# Patient Record
Sex: Female | Born: 1970
Health system: Southern US, Community
[De-identification: ages and names within clinical notes are randomized; demographics above are authoritative.]

## PROBLEM LIST (undated history)

## (undated) DIAGNOSIS — G4730 Sleep apnea, unspecified: Secondary | ICD-10-CM

## (undated) DIAGNOSIS — K219 Gastro-esophageal reflux disease without esophagitis: Secondary | ICD-10-CM

## (undated) DIAGNOSIS — R51 Headache: Secondary | ICD-10-CM

## (undated) DIAGNOSIS — M722 Plantar fascial fibromatosis: Secondary | ICD-10-CM

## (undated) DIAGNOSIS — R519 Headache, unspecified: Secondary | ICD-10-CM

## (undated) DIAGNOSIS — N920 Excessive and frequent menstruation with regular cycle: Secondary | ICD-10-CM

## (undated) DIAGNOSIS — F419 Anxiety disorder, unspecified: Secondary | ICD-10-CM

## (undated) DIAGNOSIS — Z9889 Other specified postprocedural states: Secondary | ICD-10-CM

## (undated) DIAGNOSIS — G2581 Restless legs syndrome: Secondary | ICD-10-CM

## (undated) DIAGNOSIS — Z9289 Personal history of other medical treatment: Secondary | ICD-10-CM

## (undated) DIAGNOSIS — R112 Nausea with vomiting, unspecified: Secondary | ICD-10-CM

## (undated) DIAGNOSIS — D259 Leiomyoma of uterus, unspecified: Secondary | ICD-10-CM

## (undated) HISTORY — DX: Personal history of other medical treatment: Z92.89

## (undated) HISTORY — DX: Excessive and frequent menstruation with regular cycle: N92.0

## (undated) HISTORY — DX: Leiomyoma of uterus, unspecified: D25.9

## (undated) HISTORY — DX: Plantar fascial fibromatosis: M72.2

## (undated) HISTORY — DX: Restless legs syndrome: G25.81

---

## 2008-08-18 DIAGNOSIS — D259 Leiomyoma of uterus, unspecified: Secondary | ICD-10-CM

## 2008-08-18 HISTORY — DX: Leiomyoma of uterus, unspecified: D25.9

## 2010-07-09 ENCOUNTER — Ambulatory Visit: Payer: Self-pay

## 2011-07-21 ENCOUNTER — Ambulatory Visit: Payer: Self-pay | Admitting: Physician Assistant

## 2011-08-05 ENCOUNTER — Emergency Department: Payer: Self-pay | Admitting: Internal Medicine

## 2011-08-19 HISTORY — PX: LAPAROSCOPIC SUPRACERVICAL HYSTERECTOMY: SUR797

## 2011-08-19 HISTORY — PX: LAPAROSCOPIC BILATERAL SALPINGO OOPHERECTOMY: SHX5890

## 2011-09-23 ENCOUNTER — Ambulatory Visit: Payer: Self-pay

## 2012-03-01 LAB — HM PAP SMEAR: HM Pap smear: NORMAL

## 2012-03-11 ENCOUNTER — Ambulatory Visit: Payer: Self-pay

## 2012-03-11 LAB — HCG, QUANTITATIVE, PREGNANCY: Beta Hcg, Quant.: <1 m[IU]/mL — ABNORMAL LOW

## 2012-03-11 LAB — HEMATOCRIT: HCT: 37 % (ref 35.0–47.0)

## 2012-03-25 ENCOUNTER — Ambulatory Visit: Payer: Self-pay

## 2012-03-25 LAB — PREGNANCY, URINE: Pregnancy Test, Urine: NEGATIVE m[IU]/mL

## 2012-03-26 LAB — HEMOGLOBIN: HGB: 11 g/dL — ABNORMAL LOW (ref 12.0–16.0)

## 2012-03-26 LAB — PATHOLOGY REPORT

## 2012-12-22 ENCOUNTER — Ambulatory Visit: Payer: Self-pay

## 2012-12-22 LAB — HM MAMMOGRAPHY: HM Mammogram: NORMAL

## 2012-12-28 ENCOUNTER — Ambulatory Visit: Payer: Self-pay

## 2012-12-28 DIAGNOSIS — Z9289 Personal history of other medical treatment: Secondary | ICD-10-CM

## 2012-12-28 HISTORY — DX: Personal history of other medical treatment: Z92.89

## 2012-12-30 ENCOUNTER — Ambulatory Visit (INDEPENDENT_AMBULATORY_CARE_PROVIDER_SITE_OTHER): Payer: 59 | Admitting: Internal Medicine

## 2012-12-30 ENCOUNTER — Encounter: Payer: Self-pay | Admitting: Internal Medicine

## 2012-12-30 VITALS — BP 120/80 | HR 87 | Temp 98.7°F | Ht 65.0 in | Wt 234.0 lb

## 2012-12-30 DIAGNOSIS — R0683 Snoring: Secondary | ICD-10-CM | POA: Insufficient documentation

## 2012-12-30 DIAGNOSIS — R0609 Other forms of dyspnea: Secondary | ICD-10-CM

## 2012-12-30 DIAGNOSIS — E669 Obesity, unspecified: Secondary | ICD-10-CM | POA: Insufficient documentation

## 2012-12-30 DIAGNOSIS — G2581 Restless legs syndrome: Secondary | ICD-10-CM | POA: Insufficient documentation

## 2012-12-30 DIAGNOSIS — N951 Menopausal and female climacteric states: Secondary | ICD-10-CM | POA: Insufficient documentation

## 2012-12-30 DIAGNOSIS — Z0000 Encounter for general adult medical examination without abnormal findings: Secondary | ICD-10-CM

## 2012-12-30 LAB — CBC WITH DIFFERENTIAL/PLATELET
Eosinophils Relative: 2 % (ref 0.0–5.0)
Lymphocytes Relative: 40.4 % (ref 12.0–46.0)
MCV: 83.6 fl (ref 78.0–100.0)
Monocytes Absolute: 0.7 10*3/uL (ref 0.1–1.0)
Neutrophils Relative %: 49.8 % (ref 43.0–77.0)
Platelets: 256 10*3/uL (ref 150.0–400.0)
WBC: 9.6 10*3/uL (ref 4.5–10.5)

## 2012-12-30 LAB — COMPREHENSIVE METABOLIC PANEL
Albumin: 4.2 g/dL (ref 3.5–5.2)
Alkaline Phosphatase: 75 U/L (ref 39–117)
CO2: 27 mEq/L (ref 19–32)
Calcium: 9.2 mg/dL (ref 8.4–10.5)
Chloride: 103 mEq/L (ref 96–112)
GFR: 78.71 mL/min (ref >60.00)
Glucose, Bld: 87 mg/dL (ref 70–99)
Potassium: 3.8 mEq/L (ref 3.5–5.1)
Sodium: 138 mEq/L (ref 135–145)
Total Protein: 7.6 g/dL (ref 6.0–8.3)

## 2012-12-30 LAB — LDL CHOLESTEROL, DIRECT: Direct LDL: 149.4 mg/dL

## 2012-12-30 LAB — TSH: TSH: 0.56 u[IU]/mL (ref 0.35–5.50)

## 2012-12-30 MED ORDER — ROPINIROLE HCL 0.25 MG PO TABS: 0.2500 mg | ORAL_TABLET | Freq: Every day | ORAL | Status: DC

## 2012-12-30 NOTE — Assessment & Plan Note (Signed)
Given history of obesity and snoring, recommended sleep study to evaluate for sleep apnea.

## 2012-12-30 NOTE — Assessment & Plan Note (Signed)
Restless legs limiting pt ability to sleep. Will start Requip 0.25mg  at bedtime, and plan to titrate up as tolerated.  Pt will email with update after 2-3 days on medication.

## 2012-12-30 NOTE — Assessment & Plan Note (Signed)
Symptoms increased irritability and anxious mood may be related to lack of sleep complicated by hormone changes after recent hysterectomy. We discussed potentially starting fluoxetine or sertraline to help with symptoms and with hot flashes. We also discussed some of the benefits and risks of hormone replacement. Will hold off to see how she tolerates Requip and see if symptoms are improved with better sleep.

## 2012-12-30 NOTE — Progress Notes (Signed)
Subjective:    Patient ID: Michele Rivera, female    DOB: [birth year], 42 y.o.   MRN: [id number]  HPI  42 year old female presents to establish care. She reports that she underwent hysterectomy last year. Since that time, she has noticed feeling more emotional with increased frequency of crying episodes. She also reports some increased anxiety. She reports it has been very difficult for her to sleep as she occasionally has hot flashes and has frequent episodes of restless legs. These episodes wake her from sleep. She had sleep study over 14 years ago which she reports was normal except for over 400 episodes of muscle jerking in her legs, resulting in waking from sleep. However, since that time she has gained over 20 pounds. She notes that her family reports that she snores. She feels exhausted during the day and has not been able to start an exercise program.  No outpatient prescriptions prior to visit.   No facility-administered medications prior to visit.   BP 120/80  Pulse 87  Temp(Src) 98.7 F (37.1 C) (Oral)  Ht 5\' 5"  (1.651 m)  Wt 234 lb (106.142 kg)  BMI 38.94 kg/m2  SpO2 96%  Review of Systems  Constitutional: Negative for fever, chills, appetite change, fatigue and unexpected weight change.  HENT: Negative for ear pain, congestion, sore throat, trouble swallowing, neck pain, voice change and sinus pressure.   Eyes: Negative for visual disturbance.  Respiratory: Negative for cough, shortness of breath, wheezing and stridor.   Cardiovascular: Negative for chest pain, palpitations and leg swelling.  Gastrointestinal: Negative for nausea, vomiting, abdominal pain, diarrhea, constipation, blood in stool, abdominal distention and anal bleeding.  Genitourinary: Negative for dysuria and flank pain.  Musculoskeletal: Negative for myalgias, arthralgias and gait problem.  Skin: Negative for color change and rash.  Neurological: Positive for tremors. Negative for dizziness and headaches.   Hematological: Negative for adenopathy. Does not bruise/bleed easily.  Psychiatric/Behavioral: Positive for sleep disturbance and dysphoric mood. Negative for suicidal ideas. The patient is not nervous/anxious.        Objective:   Physical Exam  Constitutional: She is oriented to person, place, and time. She appears well-developed and well-nourished. No distress.  HENT:  Head: Normocephalic and atraumatic.  Right Ear: External ear normal.  Left Ear: External ear normal.  Nose: Nose normal.  Mouth/Throat: Oropharynx is clear and moist. No oropharyngeal exudate.  Eyes: Conjunctivae are normal. Pupils are equal, round, and reactive to light. Right eye exhibits no discharge. Left eye exhibits no discharge. No scleral icterus.  Neck: Normal range of motion. Neck supple. No tracheal deviation present. No thyromegaly present.  Cardiovascular: Normal rate, regular rhythm, normal heart sounds and intact distal pulses.  Exam reveals no gallop and no friction rub.   No murmur heard. Pulmonary/Chest: Effort normal and breath sounds normal. No accessory muscle usage. Not tachypneic. No respiratory distress. She has no decreased breath sounds. She has no wheezes. She has no rhonchi. She has no rales. She exhibits no tenderness.  Abdominal: Soft. Bowel sounds are normal. She exhibits no distension. There is no tenderness. There is no rebound and no guarding.  Musculoskeletal: Normal range of motion. She exhibits no edema and no tenderness.  Lymphadenopathy:    She has no cervical adenopathy.  Neurological: She is alert and oriented to person, place, and time. No cranial nerve deficit. She exhibits normal muscle tone. Coordination normal.  Skin: Skin is warm and dry. No rash noted. She is not diaphoretic. No erythema. No pallor.  Psychiatric: She has a normal mood and affect. Her behavior is normal. Judgment and thought content normal.          Assessment & Plan:

## 2012-12-30 NOTE — Assessment & Plan Note (Signed)
Body mass index is 38.94 kg/(m^2). Suspect that limited sleep is affecting patient's ability to lose weight. Will address sleeping disorder and see if energy level is improved as a result. Will also check TSH with labs.

## 2012-12-31 LAB — VITAMIN D 25 HYDROXY (VIT D DEFICIENCY, FRACTURES): Vit D, 25-Hydroxy: 46 ng/mL (ref 30–89)

## 2013-01-06 ENCOUNTER — Encounter: Payer: Self-pay | Admitting: Internal Medicine

## 2013-01-25 ENCOUNTER — Ambulatory Visit (INDEPENDENT_AMBULATORY_CARE_PROVIDER_SITE_OTHER): Payer: 59 | Admitting: Internal Medicine

## 2013-01-25 ENCOUNTER — Encounter: Payer: Self-pay | Admitting: Internal Medicine

## 2013-01-25 VITALS — BP 110/80 | HR 85 | Temp 98.8°F | Wt 235.0 lb

## 2013-01-25 DIAGNOSIS — F411 Generalized anxiety disorder: Secondary | ICD-10-CM

## 2013-01-25 DIAGNOSIS — N951 Menopausal and female climacteric states: Secondary | ICD-10-CM

## 2013-01-25 DIAGNOSIS — G2581 Restless legs syndrome: Secondary | ICD-10-CM

## 2013-01-25 MED ORDER — FLUOXETINE HCL 20 MG PO TABS: 20.0000 mg | ORAL_TABLET | Freq: Every day | ORAL | Status: DC

## 2013-01-25 NOTE — Assessment & Plan Note (Signed)
Persistent hot flashes and increased anxiety. Will start Fluoxetine to help with symptoms. Follow up 4 weeks and prn.

## 2013-01-25 NOTE — Progress Notes (Signed)
Subjective:    Patient ID: Michele Rivera, female    DOB: 1971-06-06, 42 y.o.   MRN: [id number]  HPI 42 year old female with history of restless leg syndrome, obesity presents for followup. At her last visit she was started on Requip. She reports significant improvement in symptoms on this medication. She only occasionally wakes at night to use the restroom. She no longer has jerking movements of her lower charities. She denies any noted side effects from the medicine.  She continues to have intermittent hot flashes described as drenching. She also notes increased anxiety particularly when at work. She has never taken medication for this.  She is scheduled for sleep study.  Outpatient Encounter Prescriptions as of 01/25/2013  Medication Sig Dispense Refill  . rOPINIRole (REQUIP) 0.25 MG tablet Take 1 tablet (0.25 mg total) by mouth daily.  90 tablet  3  . FLUoxetine (PROZAC) 20 MG tablet Take 1 tablet (20 mg total) by mouth daily.  30 tablet  3   No facility-administered encounter medications on file as of 01/25/2013.   BP 110/80  Pulse 85  Temp(Src) 98.8 F (37.1 C) (Oral)  Wt 235 lb (106.595 kg)  BMI 39.11 kg/m2  SpO2 96%  Review of Systems  Constitutional: Positive for diaphoresis. Negative for fever, chills, appetite change, fatigue and unexpected weight change.  HENT: Negative for ear pain, congestion, sore throat, trouble swallowing, neck pain, voice change and sinus pressure.   Eyes: Negative for visual disturbance.  Respiratory: Negative for cough, shortness of breath, wheezing and stridor.   Cardiovascular: Negative for chest pain, palpitations and leg swelling.  Gastrointestinal: Negative for nausea, vomiting, abdominal pain, diarrhea, constipation, blood in stool, abdominal distention and anal bleeding.  Genitourinary: Negative for dysuria and flank pain.  Musculoskeletal: Negative for myalgias, arthralgias and gait problem.  Skin: Negative for color change and rash.   Neurological: Negative for dizziness and headaches.  Hematological: Negative for adenopathy. Does not bruise/bleed easily.  Psychiatric/Behavioral: Negative for suicidal ideas, sleep disturbance and dysphoric mood. The patient is nervous/anxious.        Objective:   Physical Exam  Constitutional: She is oriented to person, place, and time. She appears well-developed and well-nourished. No distress.  HENT:  Head: Normocephalic and atraumatic.  Right Ear: External ear normal.  Left Ear: External ear normal.  Nose: Nose normal.  Mouth/Throat: Oropharynx is clear and moist. No oropharyngeal exudate.  Eyes: Conjunctivae are normal. Pupils are equal, round, and reactive to light. Right eye exhibits no discharge. Left eye exhibits no discharge. No scleral icterus.  Neck: Normal range of motion. Neck supple. No tracheal deviation present. No thyromegaly present.  Cardiovascular: Normal rate, regular rhythm, normal heart sounds and intact distal pulses.  Exam reveals no gallop and no friction rub.   No murmur heard. Pulmonary/Chest: Effort normal and breath sounds normal. No accessory muscle usage. Not tachypneic. No respiratory distress. She has no decreased breath sounds. She has no wheezes. She has no rhonchi. She has no rales. She exhibits no tenderness.  Musculoskeletal: Normal range of motion. She exhibits no edema and no tenderness.  Lymphadenopathy:    She has no cervical adenopathy.  Neurological: She is alert and oriented to person, place, and time. No cranial nerve deficit. She exhibits normal muscle tone. Coordination normal.  Skin: Skin is warm and dry. No rash noted. She is not diaphoretic. No erythema. No pallor.  Psychiatric: She has a normal mood and affect. Her speech is normal and behavior is normal. Judgment and  thought content normal. Cognition and memory are normal. She expresses no suicidal ideation.          Assessment & Plan:

## 2013-01-25 NOTE — Assessment & Plan Note (Signed)
Symptoms much improved with use of Requip. Will continue.

## 2013-02-14 ENCOUNTER — Telehealth: Payer: Self-pay | Admitting: *Deleted

## 2013-02-14 ENCOUNTER — Encounter: Payer: Self-pay | Admitting: Internal Medicine

## 2013-02-14 MED ORDER — ROPINIROLE HCL 0.25 MG PO TABS: 0.5000 mg | ORAL_TABLET | Freq: Every day | ORAL | Status: DC

## 2013-02-14 NOTE — Telephone Encounter (Signed)
This has already been done and sent to pharmacy.

## 2013-02-14 NOTE — Telephone Encounter (Signed)
Pharmacy Note:  Ropinirole Hcl 0.25 mg TA   Pt stated that directions has changed, need new script

## 2013-02-14 NOTE — Telephone Encounter (Signed)
Patient sent mychart message stating she is taking 2 Requip instead of 1, that seems to work much better for her. Now she needs a new prescription sent to the pharmacy

## 2013-02-15 LAB — HM PAP SMEAR

## 2013-02-25 ENCOUNTER — Ambulatory Visit: Payer: 59 | Admitting: Internal Medicine

## 2013-03-11 ENCOUNTER — Ambulatory Visit: Payer: 59 | Admitting: Internal Medicine

## 2013-04-04 ENCOUNTER — Ambulatory Visit (INDEPENDENT_AMBULATORY_CARE_PROVIDER_SITE_OTHER): Payer: 59 | Admitting: Internal Medicine

## 2013-04-04 ENCOUNTER — Encounter: Payer: Self-pay | Admitting: Internal Medicine

## 2013-04-04 VITALS — BP 110/78 | HR 78 | Temp 98.3°F | Wt 229.0 lb

## 2013-04-04 DIAGNOSIS — R0609 Other forms of dyspnea: Secondary | ICD-10-CM

## 2013-04-04 DIAGNOSIS — R0683 Snoring: Secondary | ICD-10-CM

## 2013-04-04 DIAGNOSIS — F411 Generalized anxiety disorder: Secondary | ICD-10-CM

## 2013-04-04 DIAGNOSIS — E669 Obesity, unspecified: Secondary | ICD-10-CM

## 2013-04-04 DIAGNOSIS — N951 Menopausal and female climacteric states: Secondary | ICD-10-CM

## 2013-04-04 MED ORDER — PAROXETINE HCL 20 MG PO TABS: 20.0000 mg | ORAL_TABLET | ORAL | Status: DC

## 2013-04-04 MED ORDER — PHENTERMINE HCL 37.5 MG PO CAPS: 37.5000 mg | ORAL_CAPSULE | ORAL | Status: DC

## 2013-04-04 NOTE — Assessment & Plan Note (Signed)
Results of sleep study are pending. Will request sleep study report.

## 2013-04-04 NOTE — Progress Notes (Signed)
Subjective:    Patient ID: Michele Rivera, female    DOB: 1971/04/28, 42 y.o.   MRN: [id number]  HPI 42 year old female with history of restless legs, menopausal hot flashes, and obesity presents for followup. She was recently started on fluoxetine to help with anxiety and hot flashes. She reports symptoms of hot flashes are unchanged. However, symptoms of anxiety have been much improved with use of fluoxetine. However, ever since starting this medication, she has had nausea. She has tried changing the time of day that she takes the medicine with little improvement.  In regards to her obesity, she reports she has been following a healthier diet and has been walking every night approximately 1.5 miles. She has lost 6 pounds. She reports she is generally feeling much better.  In regards to recent sleep study, she reports he was a very difficult study and she only slept for approximately one hour. She has not yet received results on this study.  Outpatient Encounter Prescriptions as of 04/04/2013  Medication Sig Dispense Refill  . rOPINIRole (REQUIP) 0.25 MG tablet Take 2 tablets (0.5 mg total) by mouth daily.  90 tablet  3  . [DISCONTINUED] FLUoxetine (PROZAC) 20 MG tablet Take 1 tablet (20 mg total) by mouth daily.  30 tablet  3  . PARoxetine (PAXIL) 20 MG tablet Take 1 tablet (20 mg total) by mouth every morning.  30 tablet  6  . phentermine 37.5 MG capsule Take 1 capsule (37.5 mg total) by mouth every morning.  30 capsule  1  . [DISCONTINUED] phentermine 37.5 MG capsule Take 1 capsule (37.5 mg total) by mouth every morning.  30 capsule  1   No facility-administered encounter medications on file as of 04/04/2013.   BP 110/78  Pulse 78  Temp(Src) 98.3 F (36.8 C) (Oral)  Wt 229 lb (103.874 kg)  BMI 38.11 kg/m2  SpO2 97%  Review of Systems  Constitutional: Positive for diaphoresis. Negative for fever, chills, appetite change, fatigue and unexpected weight change.  HENT: Negative for ear  pain, congestion, sore throat, trouble swallowing, neck pain, voice change and sinus pressure.   Eyes: Negative for visual disturbance.  Respiratory: Negative for cough, shortness of breath, wheezing and stridor.   Cardiovascular: Negative for chest pain, palpitations and leg swelling.  Gastrointestinal: Positive for nausea. Negative for vomiting, abdominal pain, diarrhea, constipation, blood in stool, abdominal distention and anal bleeding.  Genitourinary: Negative for dysuria and flank pain.  Musculoskeletal: Negative for myalgias, arthralgias and gait problem.  Skin: Negative for color change and rash.  Neurological: Negative for dizziness and headaches.  Hematological: Negative for adenopathy. Does not bruise/bleed easily.  Psychiatric/Behavioral: Negative for suicidal ideas, sleep disturbance and dysphoric mood. The patient is not nervous/anxious.        Objective:   Physical Exam  Constitutional: She is oriented to person, place, and time. She appears well-developed and well-nourished. No distress.  HENT:  Head: Normocephalic and atraumatic.  Right Ear: External ear normal.  Left Ear: External ear normal.  Nose: Nose normal.  Mouth/Throat: Oropharynx is clear and moist. No oropharyngeal exudate.  Eyes: Conjunctivae are normal. Pupils are equal, round, and reactive to light. Right eye exhibits no discharge. Left eye exhibits no discharge. No scleral icterus.  Neck: Normal range of motion. Neck supple. No tracheal deviation present. No thyromegaly present.  Cardiovascular: Normal rate, regular rhythm, normal heart sounds and intact distal pulses.  Exam reveals no gallop and no friction rub.   No murmur heard. Pulmonary/Chest: Effort  normal and breath sounds normal. No accessory muscle usage. Not tachypneic. No respiratory distress. She has no decreased breath sounds. She has no wheezes. She has no rhonchi. She has no rales. She exhibits no tenderness.  Musculoskeletal: Normal range of  motion. She exhibits no edema and no tenderness.  Lymphadenopathy:    She has no cervical adenopathy.  Neurological: She is alert and oriented to person, place, and time. No cranial nerve deficit. She exhibits normal muscle tone. Coordination normal.  Skin: Skin is warm and dry. No rash noted. She is not diaphoretic. No erythema. No pallor.  Psychiatric: She has a normal mood and affect. Her behavior is normal. Judgment and thought content normal.          Assessment & Plan:

## 2013-04-04 NOTE — Assessment & Plan Note (Signed)
Wt Readings from Last 3 Encounters:  04/04/13 229 lb (103.874 kg)  01/25/13 235 lb (106.595 kg)  12/30/12 234 lb (106.142 kg)   Congratulated patient on 6 pounds weight loss. Encouraged her to continue efforts at healthy diet and regular physical activity. Encouraged her to keep a food diary. Will start phentermine to help with appetite suppression. Discussed potential risk of this medication. Followup in 4 weeks.

## 2013-04-04 NOTE — Assessment & Plan Note (Signed)
Symptoms of hot flashes poorly controlled with addition of fluoxetine. Will try changing to paroxetine 20 mg daily. Discussed other alterntives including SERMs if this is not helpful. Follow up 4 weeks and prn.

## 2013-04-04 NOTE — Assessment & Plan Note (Signed)
Symptoms of anxiety are well controlled with use of fluoxetine however patient is having nausea with this medication. Will try changing to paroxetine. Follow up 4 weeks and prn.

## 2013-04-05 ENCOUNTER — Telehealth: Payer: Self-pay | Admitting: Internal Medicine

## 2013-04-05 NOTE — Telephone Encounter (Signed)
Were you requesting anything from [facility] about this patient?

## 2013-04-05 NOTE — Telephone Encounter (Signed)
Yes, I need a copy of her original sleep study.

## 2013-04-05 NOTE — Telephone Encounter (Signed)
Michele Rivera from [facility] called inquiring on a medical release form that was faxed to her on this patient for records. The patient had her initial sleep study on 8/9, records were faxed to [place] according to them, and the patient is s/u for her Cpap appointment on 9/20. What records are we needing?

## 2013-04-06 NOTE — Telephone Encounter (Signed)
Called and they will fax that information to [place].

## 2013-04-07 ENCOUNTER — Telehealth: Payer: Self-pay | Admitting: Internal Medicine

## 2013-04-07 ENCOUNTER — Encounter: Payer: Self-pay | Admitting: Internal Medicine

## 2013-04-07 NOTE — Telephone Encounter (Signed)
Sleep study showed OSA, recommend CPAP titration

## 2013-04-11 ENCOUNTER — Encounter: Payer: Self-pay | Admitting: Internal Medicine

## 2013-04-26 ENCOUNTER — Encounter: Payer: Self-pay | Admitting: Internal Medicine

## 2013-04-26 MED ORDER — FLUOXETINE HCL 20 MG PO TABS: 20.0000 mg | ORAL_TABLET | Freq: Every day | ORAL | Status: DC

## 2013-05-02 ENCOUNTER — Encounter: Payer: Self-pay | Admitting: Internal Medicine

## 2013-05-03 ENCOUNTER — Ambulatory Visit: Payer: 59 | Admitting: Internal Medicine

## 2013-06-07 ENCOUNTER — Encounter: Payer: Self-pay | Admitting: Internal Medicine

## 2013-06-10 ENCOUNTER — Ambulatory Visit: Payer: 59 | Admitting: Internal Medicine

## 2013-06-14 ENCOUNTER — Ambulatory Visit: Payer: 59 | Admitting: Internal Medicine

## 2013-06-27 ENCOUNTER — Encounter: Payer: Self-pay | Admitting: *Deleted

## 2013-06-28 ENCOUNTER — Ambulatory Visit (INDEPENDENT_AMBULATORY_CARE_PROVIDER_SITE_OTHER): Payer: 59 | Admitting: Internal Medicine

## 2013-06-28 ENCOUNTER — Encounter: Payer: Self-pay | Admitting: Internal Medicine

## 2013-06-28 VITALS — BP 124/78 | HR 82 | Temp 98.1°F | Resp 12 | Wt 227.0 lb

## 2013-06-28 DIAGNOSIS — H6690 Otitis media, unspecified, unspecified ear: Secondary | ICD-10-CM

## 2013-06-28 DIAGNOSIS — E669 Obesity, unspecified: Secondary | ICD-10-CM

## 2013-06-28 DIAGNOSIS — G2581 Restless legs syndrome: Secondary | ICD-10-CM

## 2013-06-28 DIAGNOSIS — H6692 Otitis media, unspecified, left ear: Secondary | ICD-10-CM

## 2013-06-28 MED ORDER — ROPINIROLE HCL 1 MG PO TABS: 1.0000 mg | ORAL_TABLET | Freq: Every day | ORAL | Status: DC

## 2013-06-28 MED ORDER — AMOXICILLIN-POT CLAVULANATE 875-125 MG PO TABS: 1.0000 | ORAL_TABLET | Freq: Two times a day (BID) | ORAL | Status: DC

## 2013-06-28 MED ORDER — PHENTERMINE HCL 37.5 MG PO CAPS: ORAL_CAPSULE | ORAL | Status: DC

## 2013-06-28 MED ORDER — FLUOXETINE HCL 20 MG PO TABS: 20.0000 mg | ORAL_TABLET | Freq: Every day | ORAL | Status: DC

## 2013-06-28 NOTE — Progress Notes (Signed)
Pre-visit discussion using our clinic review tool. No additional management support is needed unless otherwise documented below in the visit note.  

## 2013-06-29 DIAGNOSIS — H6692 Otitis media, unspecified, left ear: Secondary | ICD-10-CM | POA: Insufficient documentation

## 2013-06-29 NOTE — Assessment & Plan Note (Signed)
Exam is consistent with left otitis media. Will treat with Augmentin. Patient will call if symptoms are not improving. Followup for recheck in 4 weeks.

## 2013-06-29 NOTE — Progress Notes (Signed)
Subjective:    Patient ID: Michele Rivera, female    DOB: Oct 12, 1970, 42 y.o.   MRN: [id number]  HPI 42 year old female with history of restless legs, anxiety, and obesity presents for followup. In regards to her obesity, she reports she tried using phentermine to help with appetite suppression but developed shaking chills on this medication. She has tried to follow a healthy diet and exercise by walking. She has lost another 2 pounds.  In regards to restless legs, she reports symptoms have recently worsened. She is currently taking 0.5 mg of Requip at bedtime. She is waking up throughout the night with cramping and movement in her legs. She denies any daytime drowsiness.  She is also concerned about 3 week history of nasal congestion and left ear pain described as pressure. She denies any recent fever or chills. She denies any change in hearing. She has had some occasional drainage from her left ear.  Outpatient Prescriptions Prior to Visit  Medication Sig Dispense Refill  . rOPINIRole (REQUIP) 0.25 MG tablet Take 2 tablets (0.5 mg total) by mouth daily.  90 tablet  3  . FLUoxetine (PROZAC) 20 MG tablet Take 1 tablet (20 mg total) by mouth daily.  30 tablet  3  . phentermine 37.5 MG capsule Take 1 capsule (37.5 mg total) by mouth every morning.  30 capsule  1   No facility-administered medications prior to visit.   BP 124/78  Pulse 82  Temp(Src) 98.1 F (36.7 C) (Oral)  Resp 12  Wt 227 lb (102.967 kg)  SpO2 98%  Review of Systems  Constitutional: Negative for fever, chills, appetite change, fatigue and unexpected weight change.  HENT: Positive for congestion, ear discharge and ear pain. Negative for sinus pressure, sore throat, trouble swallowing and voice change.   Eyes: Negative for visual disturbance.  Respiratory: Negative for cough, shortness of breath, wheezing and stridor.   Cardiovascular: Negative for chest pain, palpitations and leg swelling.  Gastrointestinal: Negative  for nausea, vomiting, abdominal pain, diarrhea, constipation, blood in stool, abdominal distention and anal bleeding.  Genitourinary: Negative for dysuria and flank pain.  Musculoskeletal: Negative for arthralgias, gait problem, myalgias and neck pain.  Skin: Negative for color change and rash.  Neurological: Negative for dizziness and headaches.  Hematological: Negative for adenopathy. Does not bruise/bleed easily.  Psychiatric/Behavioral: Negative for suicidal ideas, sleep disturbance and dysphoric mood. The patient is not nervous/anxious.        Objective:   Physical Exam  Constitutional: She is oriented to person, place, and time. She appears well-developed and well-nourished. No distress.  HENT:  Head: Normocephalic and atraumatic.  Right Ear: Tympanic membrane, external ear and ear canal normal.  Left Ear: External ear and ear canal normal. Tympanic membrane is erythematous and bulging. A middle ear effusion is present.  Nose: Nose normal.  Mouth/Throat: Oropharynx is clear and moist. No oropharyngeal exudate.  Eyes: Conjunctivae are normal. Pupils are equal, round, and reactive to light. Right eye exhibits no discharge. Left eye exhibits no discharge. No scleral icterus.  Neck: Normal range of motion. Neck supple. No tracheal deviation present. No thyromegaly present.  Cardiovascular: Normal rate, regular rhythm, normal heart sounds and intact distal pulses.  Exam reveals no gallop and no friction rub.   No murmur heard. Pulmonary/Chest: Effort normal and breath sounds normal. No accessory muscle usage. Not tachypneic. No respiratory distress. She has no decreased breath sounds. She has no wheezes. She has no rhonchi. She has no rales. She exhibits no tenderness.  Musculoskeletal: Normal range of motion. She exhibits no edema and no tenderness.  Lymphadenopathy:    She has no cervical adenopathy.  Neurological: She is alert and oriented to person, place, and time. No cranial nerve  deficit. She exhibits normal muscle tone. Coordination normal.  Skin: Skin is warm and dry. No rash noted. She is not diaphoretic. No erythema. No pallor.  Psychiatric: She has a normal mood and affect. Her behavior is normal. Judgment and thought content normal.          Assessment & Plan:

## 2013-06-29 NOTE — Assessment & Plan Note (Signed)
Wt Readings from Last 3 Encounters:  06/28/13 227 lb (102.967 kg)  04/04/13 229 lb (103.874 kg)  01/25/13 235 lb (106.595 kg)   Body mass index is 37.77 kg/(m^2). Congratulated patient on another 2 pound weight loss. We discussed other methods to help with appetite suppression including using Wellbutrin. She would like to try to reduce dose of phentermine to see if this works for her. Will try 15 mg every morning. If any recurrent shaking chills or other symptoms, she will stop the medication.

## 2013-06-29 NOTE — Assessment & Plan Note (Signed)
Symptoms poorly controlled with current dose of Requip. Will increase Requip dose to 1 mg at bedtime. Patient will call if symptoms are not improving. We also discussed less traditional methods to help improve leg cramping at night.

## 2013-07-01 MED ORDER — BUPROPION HCL ER (XL) 150 MG PO TB24: 150.0000 mg | ORAL_TABLET | Freq: Every day | ORAL | Status: DC

## 2013-07-01 NOTE — Addendum Note (Signed)
Addended by: Ronna Polio A on: 07/01/2013 10:53 AM   Modules accepted: Orders

## 2013-07-26 ENCOUNTER — Ambulatory Visit: Payer: 59 | Admitting: Internal Medicine

## 2013-08-30 ENCOUNTER — Ambulatory Visit (INDEPENDENT_AMBULATORY_CARE_PROVIDER_SITE_OTHER): Payer: 59 | Admitting: Internal Medicine

## 2013-08-30 ENCOUNTER — Encounter: Payer: Self-pay | Admitting: Internal Medicine

## 2013-08-30 VITALS — BP 110/80 | HR 88 | Temp 97.8°F | Wt 228.0 lb

## 2013-08-30 DIAGNOSIS — F411 Generalized anxiety disorder: Secondary | ICD-10-CM

## 2013-08-30 DIAGNOSIS — E669 Obesity, unspecified: Secondary | ICD-10-CM

## 2013-08-30 DIAGNOSIS — N644 Mastodynia: Secondary | ICD-10-CM

## 2013-08-30 MED ORDER — BUPROPION HCL ER (XL) 300 MG PO TB24: 300.0000 mg | ORAL_TABLET | Freq: Every day | ORAL | Status: DC

## 2013-08-30 MED ORDER — FLUOXETINE HCL 20 MG PO TABS: 20.0000 mg | ORAL_TABLET | Freq: Every day | ORAL | Status: DC

## 2013-08-30 NOTE — Progress Notes (Signed)
   Subjective:    Patient ID: Michele Rivera, female    DOB: 1970/08/20, 43 y.o.   MRN: [id number]  HPI 43 year old female with history of obesity, restless legs, and anxiety presents for followup. At her last visit, we added Wellbutrin to help better control appetite and assist with weight loss. She reports some improvement with use of Wellbutrin however has only been taking the medication for about 2 weeks. She notes some dietary indiscretion over the holidays. She is not exercising.  She is concerned today about one-month history of left lateral breast pain. She denies any palpable abnormalities. She denies any skin changes. She denies any trauma to her breasts. She recently had a mammogram performed which was reportedly normal.   Review of Systems Review of Systems - Negative except lateral left breast pain Gen - No fever, chills, change in energy level. Pulm - No shortness of breath, cough CV - No chest pain. GI - No abdominal pain, change in bowel habits. MUSC - No myalgia, arthralgia.     Objective:   Physical Exam BP 110/80  Pulse 88  Temp(Src) 97.8 F (36.6 C) (Oral)  Wt 228 lb (103.42 kg)  SpO2 98%  General Appearance:  Alert, cooperative, no distress, appears stated age  Head:  Normocephalic, without obvious abnormality, atraumatic  Eyes:  PERRL, conjunctiva/corneas clear, EOM's intact, fundi benign, both eyes  Ears:  Normal TM's and external ear canals, both ears  Nose: Nares normal, septum midline,mucosa normal, no drainage or sinus tenderness  Throat: Lips, mucosa, and tongue normal; teeth and gums normal  Back:   Symmetric, no curvature, ROM normal, no CVA tenderness  Lungs:   Clear to auscultation bilaterally, respirations unlabored  Breasts:  No masses or tenderness  Heart:  Regular rate and rhythm, S1 and S2 normal, no murmur, rub, or gallop  Extremities: Extremities normal, atraumatic, no cyanosis or edema  Skin: Skin color, texture, turgor normal, no  rashes or lesions  Lymph nodes: Cervical, supraclavicular, and axillary nodes normal  Neurologic: Normal          Assessment & Plan:

## 2013-08-30 NOTE — Assessment & Plan Note (Signed)
Wt Readings from Last 3 Encounters:  08/30/13 228 lb (103.42 kg)  06/28/13 227 lb (102.967 kg)  04/04/13 229 lb (103.874 kg)   Weight unchanged, however pt notes improvement in appetite on Wellbutrin. Will try increasing dose of wellbutrin to 300mg  daily to help with appetite suppression. Encouraged caloric restriction and regular exercise.

## 2013-08-30 NOTE — Progress Notes (Signed)
Pre-visit discussion using our clinic review tool. No additional management support is needed unless otherwise documented below in the visit note.  

## 2013-08-30 NOTE — Assessment & Plan Note (Signed)
Symptoms of anxiety well controlled on Fluoxetine and Wellbutrin. Will continue.

## 2013-08-30 NOTE — Assessment & Plan Note (Signed)
One month of lateral left breast/axillary pain. Exam is normal today. Pt reports recent mammogram was normal. Will request results on this. Suspect muscular strain leading to pain. Will plan repeat exam in 2-4 weeks.

## 2013-09-27 ENCOUNTER — Ambulatory Visit: Payer: 59 | Admitting: Internal Medicine

## 2013-10-04 ENCOUNTER — Ambulatory Visit: Payer: 59 | Admitting: Internal Medicine

## 2013-10-25 ENCOUNTER — Other Ambulatory Visit: Payer: Self-pay | Admitting: Internal Medicine

## 2013-10-27 ENCOUNTER — Ambulatory Visit: Payer: 59 | Admitting: Internal Medicine

## 2013-10-27 ENCOUNTER — Encounter: Payer: Self-pay | Admitting: Internal Medicine

## 2013-11-14 ENCOUNTER — Ambulatory Visit (INDEPENDENT_AMBULATORY_CARE_PROVIDER_SITE_OTHER): Payer: 59 | Admitting: Internal Medicine

## 2013-11-14 ENCOUNTER — Encounter: Payer: Self-pay | Admitting: Internal Medicine

## 2013-11-14 VITALS — BP 128/82 | HR 82 | Temp 98.1°F | Wt 226.0 lb

## 2013-11-14 DIAGNOSIS — N644 Mastodynia: Secondary | ICD-10-CM

## 2013-11-14 DIAGNOSIS — E669 Obesity, unspecified: Secondary | ICD-10-CM

## 2013-11-14 DIAGNOSIS — Z1239 Encounter for other screening for malignant neoplasm of breast: Secondary | ICD-10-CM

## 2013-11-14 DIAGNOSIS — F458 Other somatoform disorders: Secondary | ICD-10-CM | POA: Insufficient documentation

## 2013-11-14 DIAGNOSIS — J309 Allergic rhinitis, unspecified: Secondary | ICD-10-CM

## 2013-11-14 LAB — COMPREHENSIVE METABOLIC PANEL
ALK PHOS: 80 U/L (ref 39–117)
ALT: 24 U/L (ref 0–35)
AST: 22 U/L (ref 0–37)
Albumin: 4.4 g/dL (ref 3.5–5.2)
BUN: 16 mg/dL (ref 6–23)
CO2: 26 meq/L (ref 19–32)
CREATININE: 0.9 mg/dL (ref 0.4–1.2)
Calcium: 9.4 mg/dL (ref 8.4–10.5)
Chloride: 102 mEq/L (ref 96–112)
GFR: 72.39 mL/min (ref >60.00)
Glucose, Bld: 98 mg/dL (ref 70–99)
Potassium: 4.3 mEq/L (ref 3.5–5.1)
SODIUM: 137 meq/L (ref 135–145)
TOTAL PROTEIN: 7.8 g/dL (ref 6.0–8.3)
Total Bilirubin: 0.6 mg/dL (ref 0.3–1.2)

## 2013-11-14 LAB — TSH: TSH: 0.7 u[IU]/mL (ref 0.35–5.50)

## 2013-11-14 MED ORDER — CYCLOBENZAPRINE HCL 5 MG PO TABS: 5.0000 mg | ORAL_TABLET | Freq: Every evening | ORAL | Status: DC | PRN

## 2013-11-14 MED ORDER — TRIAMCINOLONE ACETONIDE 55 MCG/ACT NA AERO: 2.0000 | INHALATION_SPRAY | Freq: Every day | NASAL | Status: DC

## 2013-11-14 NOTE — Progress Notes (Signed)
Pre visit review using our clinic review tool, if applicable. No additional management support is needed unless otherwise documented below in the visit note. 

## 2013-11-14 NOTE — Assessment & Plan Note (Signed)
Will restart prn Flexeril as this worked well for her in the past. Follow up prn.

## 2013-11-14 NOTE — Assessment & Plan Note (Signed)
Recent worsening of clear nasal drainage consistent with allergic rhinitis. Will start Nasacort. Follow up prn if symptoms not improving.

## 2013-11-14 NOTE — Patient Instructions (Signed)
Look into nutrition counseling through [facility].

## 2013-11-14 NOTE — Progress Notes (Signed)
Subjective:    Patient ID: Michele Rivera, female    DOB: March 05, 1971, 43 y.o.   MRN: [id number]  HPI 43YO female presents for follow up.  She is concerned about her weight. She has been exercising for up to an hour each day using the elliptical and lifting weights. She has been limiting her intake of processed sugars and overall calories. She has not lost any weight. She questions whether she might benefit from supplemental medications.  She is also concerned about recent grinding of her teeth at night. This sometimes keeps her awake. In the past, she has used Flexeril for this with significant improvement.  She also notes recent worsening of seasonal allergy symptoms are clear nasal drainage and occasional sneezing. No fever, chills, cough.  Breast pain noted previously has resolved.   Review of Systems  Constitutional: Negative for fever, chills, appetite change, fatigue and unexpected weight change.  HENT: Positive for congestion, postnasal drip, rhinorrhea and sneezing. Negative for ear pain, sinus pressure, sore throat, trouble swallowing and voice change.   Eyes: Negative for visual disturbance.  Respiratory: Negative for cough, shortness of breath, wheezing and stridor.   Cardiovascular: Negative for chest pain, palpitations and leg swelling.  Gastrointestinal: Negative for nausea, vomiting, abdominal pain, diarrhea, constipation, blood in stool, abdominal distention and anal bleeding.  Genitourinary: Negative for dysuria and flank pain.  Musculoskeletal: Positive for myalgias. Negative for arthralgias, gait problem and neck pain.  Skin: Negative for color change and rash.  Neurological: Negative for dizziness and headaches.  Hematological: Negative for adenopathy. Does not bruise/bleed easily.  Psychiatric/Behavioral: Negative for suicidal ideas, sleep disturbance and dysphoric mood. The patient is not nervous/anxious.        Objective:    BP 128/82  Pulse 82   Temp(Src) 98.1 F (36.7 C) (Oral)  Wt 226 lb (102.513 kg)  SpO2 97% Physical Exam  Constitutional: She is oriented to person, place, and time. She appears well-developed and well-nourished. No distress.  HENT:  Head: Normocephalic and atraumatic.  Right Ear: External ear normal.  Left Ear: External ear normal.  Nose: Nose normal.  Mouth/Throat: Oropharynx is clear and moist. No oropharyngeal exudate.  Eyes: Conjunctivae are normal. Pupils are equal, round, and reactive to light. Right eye exhibits no discharge. Left eye exhibits no discharge. No scleral icterus.  Neck: Normal range of motion. Neck supple. No tracheal deviation present. No thyromegaly present.  Cardiovascular: Normal rate, regular rhythm, normal heart sounds and intact distal pulses.  Exam reveals no gallop and no friction rub.   No murmur heard. Pulmonary/Chest: Effort normal and breath sounds normal. No accessory muscle usage. Not tachypneic. No respiratory distress. She has no decreased breath sounds. She has no wheezes. She has no rhonchi. She has no rales. She exhibits no tenderness. Right breast exhibits no inverted nipple, no mass, no nipple discharge, no skin change and no tenderness. Left breast exhibits no inverted nipple, no mass, no nipple discharge, no skin change and no tenderness. Breasts are symmetrical.  Musculoskeletal: Normal range of motion. She exhibits no edema and no tenderness.  Lymphadenopathy:    She has no cervical adenopathy.  Neurological: She is alert and oriented to person, place, and time. No cranial nerve deficit. She exhibits normal muscle tone. Coordination normal.  Skin: Skin is warm and dry. No rash noted. She is not diaphoretic. No erythema. No pallor.  Psychiatric: She has a normal mood and affect. Her behavior is normal. Judgment and thought content normal.  Assessment & Plan:   Problem List Items Addressed This Visit   Allergic rhinitis     Recent worsening of clear  nasal drainage consistent with allergic rhinitis. Will start Nasacort. Follow up prn if symptoms not improving.    Relevant Medications      triamcinolone (NASACORT ALLERGY 24HR) 55 MCG/ACT AERO nasal inhaler   Breast pain, left   Obesity, unspecified - Primary      Wt Readings from Last 3 Encounters:  11/14/13 226 lb (102.513 kg)  08/30/13 228 lb (103.42 kg)  06/28/13 227 lb (102.967 kg)   Encouraged continue efforts at healthy diet and exercise. Discussed referral for nutrition counseling. She believes this is available through her work and she will look into it. Follow up 3 months and prn.    Relevant Orders      Comp Met (CMET)      TSH   Screening for breast cancer   Relevant Orders      MM Digital Screening   Teeth grinding   Relevant Medications      cyclobenzaprine (FLEXERIL) tablet       Return in about 3 months (around 02/14/2014) for Physical.   

## 2013-11-14 NOTE — Assessment & Plan Note (Signed)
Symptoms have resolved and exam is normal. Initial symptoms likely secondary to muscular strain. Will proceed with yearly mammograms as scheduled.

## 2013-11-14 NOTE — Assessment & Plan Note (Signed)
Wt Readings from Last 3 Encounters:  11/14/13 226 lb (102.513 kg)  08/30/13 228 lb (103.42 kg)  06/28/13 227 lb (102.967 kg)   Encouraged continue efforts at healthy diet and exercise. Discussed referral for nutrition counseling. She believes this is available through her work and she will look into it. Follow up 3 months and prn.

## 2013-11-15 ENCOUNTER — Encounter: Payer: Self-pay | Admitting: Emergency Medicine

## 2014-01-05 ENCOUNTER — Ambulatory Visit: Payer: Self-pay | Admitting: Internal Medicine

## 2014-01-05 LAB — HM MAMMOGRAPHY: HM Mammogram: NORMAL

## 2014-01-12 ENCOUNTER — Encounter: Payer: Self-pay | Admitting: Internal Medicine

## 2014-01-12 ENCOUNTER — Encounter: Payer: Self-pay | Admitting: *Deleted

## 2014-01-20 ENCOUNTER — Encounter: Payer: Self-pay | Admitting: Internal Medicine

## 2014-02-10 ENCOUNTER — Encounter: Payer: Self-pay | Admitting: Internal Medicine

## 2014-02-15 ENCOUNTER — Encounter: Payer: Self-pay | Admitting: Internal Medicine

## 2014-02-15 ENCOUNTER — Ambulatory Visit (INDEPENDENT_AMBULATORY_CARE_PROVIDER_SITE_OTHER): Payer: 59 | Admitting: Internal Medicine

## 2014-02-15 ENCOUNTER — Ambulatory Visit: Payer: Self-pay | Admitting: Internal Medicine

## 2014-02-15 VITALS — BP 118/84 | HR 84 | Temp 98.0°F | Ht 65.0 in | Wt 229.5 lb

## 2014-02-15 DIAGNOSIS — Z0000 Encounter for general adult medical examination without abnormal findings: Secondary | ICD-10-CM

## 2014-02-15 DIAGNOSIS — E669 Obesity, unspecified: Secondary | ICD-10-CM

## 2014-02-15 DIAGNOSIS — G2581 Restless legs syndrome: Secondary | ICD-10-CM

## 2014-02-15 DIAGNOSIS — R059 Cough, unspecified: Secondary | ICD-10-CM

## 2014-02-15 DIAGNOSIS — R05 Cough: Secondary | ICD-10-CM | POA: Insufficient documentation

## 2014-02-15 DIAGNOSIS — G4733 Obstructive sleep apnea (adult) (pediatric): Secondary | ICD-10-CM | POA: Insufficient documentation

## 2014-02-15 LAB — COMPREHENSIVE METABOLIC PANEL
ALBUMIN: 4.1 g/dL (ref 3.5–5.2)
ALK PHOS: 74 U/L (ref 39–117)
ALT: 19 U/L (ref 0–35)
AST: 18 U/L (ref 0–37)
BILIRUBIN TOTAL: 0.6 mg/dL (ref 0.2–1.2)
BUN: 16 mg/dL (ref 6–23)
CO2: 28 mEq/L (ref 19–32)
Calcium: 9.1 mg/dL (ref 8.4–10.5)
Chloride: 104 mEq/L (ref 96–112)
Creatinine, Ser: 0.8 mg/dL (ref 0.4–1.2)
GFR: 81.65 mL/min (ref >60.00)
Glucose, Bld: 108 mg/dL — ABNORMAL HIGH (ref 70–99)
POTASSIUM: 4.3 meq/L (ref 3.5–5.1)
Sodium: 138 mEq/L (ref 135–145)
Total Protein: 7.3 g/dL (ref 6.0–8.3)

## 2014-02-15 LAB — TSH: TSH: 1.12 u[IU]/mL (ref 0.35–4.50)

## 2014-02-15 LAB — CBC WITH DIFFERENTIAL/PLATELET
BASOS PCT: 0.6 % (ref 0.0–3.0)
Basophils Absolute: 0 10*3/uL (ref 0.0–0.1)
EOS PCT: 3.8 % (ref 0.0–5.0)
Eosinophils Absolute: 0.3 10*3/uL (ref 0.0–0.7)
HEMATOCRIT: 40 % (ref 36.0–46.0)
HEMOGLOBIN: 13.5 g/dL (ref 12.0–15.0)
LYMPHS ABS: 2.6 10*3/uL (ref 0.7–4.0)
Lymphocytes Relative: 34.6 % (ref 12.0–46.0)
MCHC: 33.7 g/dL (ref 30.0–36.0)
MCV: 86.8 fl (ref 78.0–100.0)
MONOS PCT: 6.6 % (ref 3.0–12.0)
Monocytes Absolute: 0.5 10*3/uL (ref 0.1–1.0)
Neutro Abs: 4.1 10*3/uL (ref 1.4–7.7)
Neutrophils Relative %: 54.4 % (ref 43.0–77.0)
Platelets: 254 10*3/uL (ref 150.0–400.0)
RBC: 4.62 Mil/uL (ref 3.87–5.11)
RDW: 12.9 % (ref 11.5–15.5)
WBC: 7.6 10*3/uL (ref 4.0–10.5)

## 2014-02-15 LAB — MICROALBUMIN / CREATININE URINE RATIO
Creatinine,U: 160.1 mg/dL
Microalb Creat Ratio: 0.2 mg/g (ref 0.0–30.0)
Microalb, Ur: 0.4 mg/dL (ref 0.0–1.9)

## 2014-02-15 LAB — LIPID PANEL
CHOL/HDL RATIO: 5
Cholesterol: 231 mg/dL — ABNORMAL HIGH (ref 0–200)
HDL: 48 mg/dL (ref >39.00)
LDL CALC: 164 mg/dL — AB (ref 0–99)
NonHDL: 183
TRIGLYCERIDES: 94 mg/dL (ref 0.0–149.0)
VLDL: 18.8 mg/dL (ref 0.0–40.0)

## 2014-02-15 LAB — VITAMIN D 25 HYDROXY (VIT D DEFICIENCY, FRACTURES): VITD: 20.55 ng/mL

## 2014-02-15 LAB — HEMOGLOBIN A1C: Hgb A1c MFr Bld: 5.9 % (ref 4.6–6.5)

## 2014-02-15 MED ORDER — PRAMIPEXOLE DIHYDROCHLORIDE ER 0.375 MG PO TB24: 0.3750 mg | ORAL_TABLET | Freq: Every day | ORAL | Status: DC

## 2014-02-15 MED ORDER — OMEPRAZOLE-SODIUM BICARBONATE 20-1100 MG PO CAPS: 1.0000 | ORAL_CAPSULE | Freq: Every day | ORAL | Status: DC

## 2014-02-15 NOTE — Assessment & Plan Note (Signed)
Unable to tolerate CPAP in the past because of claustrophobia. Will set up evaluation with sleep specialist.

## 2014-02-15 NOTE — Assessment & Plan Note (Signed)
Persistent symptoms on Requip. Will change to Mirapex. She will email with update and follow up in 4 weeks.

## 2014-02-15 NOTE — Patient Instructions (Addendum)
Stop Requip. Start Mirapex daily. Email with update.  We will set up referral to sleep specialist and bariatric surgeon.  Follow up in 4 weeks.

## 2014-02-15 NOTE — Assessment & Plan Note (Signed)
General medical exam normal today. Breast and pelvic deferred as completed by her OB. Mammogram UTD and reviewed with pt. Immunizations UTD. Will check labs, CBC, CMP, lipids, TSH, A1c. Encouraged healthy diet and weight loss.

## 2014-02-15 NOTE — Assessment & Plan Note (Signed)
Wt Readings from Last 3 Encounters:  02/15/14 229 lb 8 oz (104.101 kg)  11/14/13 226 lb (102.513 kg)  08/30/13 228 lb (103.42 kg)   Body mass index is 38.19 kg/(m^2). Encouraged healthy diet and exercise. Will set up referral to discuss options for bariatric surgery.

## 2014-02-15 NOTE — Assessment & Plan Note (Signed)
Persistent cough. Exam normal. Suspect secondary to recent viral infection, however given persistence, will get CXR.

## 2014-02-15 NOTE — Progress Notes (Signed)
Subjective:    Patient ID: Michele Rivera, female    DOB: Nov 04, 1970, 43 y.o.   MRN: [id number]  HPI 43YO female presents for annual exam. She has several concerns today.  Concerned about apneic events witnessed by husband at night. Tried sleep study in the past, but had severe claustrophobia. Would like to be set up with sleep specialist.  Worried about weight. Not following a specific diet, but trying to limit calories. Not exercising as much. Frustrated by lack of weight loss when exercising. Would like to consider bariatric surgery.  Restless legs - Symptoms persistent despite use of Requip. Waking her up at night. Also notes drowsiness from the medication. Another physician recommended Mirapex. Would like to try Mirapex.  Cough - Has had persistent cough x 6 weeks. Treated for possible pneumonia in 12/2013 with antibiotics. Persistent, non-productive cough over last few weeks. No dyspnea, fever.  UTD on PAP and pelvic which are performed by her OB. S/p hysterectomy. Breast exam also performed by OB. Mammogram 12/2013 normal, and reviewed today.   Review of Systems  Constitutional: Positive for fatigue. Negative for fever, chills, appetite change and unexpected weight change.  HENT: Negative for congestion, postnasal drip, rhinorrhea and trouble swallowing.   Eyes: Negative for visual disturbance.  Respiratory: Positive for apnea (at night) and cough. Negative for shortness of breath, wheezing and stridor.   Cardiovascular: Negative for chest pain and leg swelling.  Gastrointestinal: Negative for nausea, abdominal pain, diarrhea, constipation and rectal pain.  Endocrine: Negative for cold intolerance and heat intolerance.  Musculoskeletal: Positive for myalgias.  Skin: Negative for color change and rash.  Neurological: Negative for weakness, numbness and headaches.  Hematological: Negative for adenopathy. Does not bruise/bleed easily.  Psychiatric/Behavioral: Negative for  dysphoric mood. The patient is not nervous/anxious.        Objective:    BP 118/84  Pulse 84  Temp(Src) 98 F (36.7 C) (Oral)  Ht 5\' 5"  (1.651 m)  Wt 229 lb 8 oz (104.101 kg)  BMI 38.19 kg/m2  SpO2 97% Physical Exam  Constitutional: She is oriented to person, place, and time. She appears well-developed and well-nourished. No distress.  HENT:  Head: Normocephalic and atraumatic.  Right Ear: External ear normal.  Left Ear: External ear normal.  Nose: Nose normal.  Mouth/Throat: Oropharynx is clear and moist. No oropharyngeal exudate.  Eyes: Conjunctivae and EOM are normal. Pupils are equal, round, and reactive to light. Right eye exhibits no discharge.  Neck: Normal range of motion. Neck supple. No thyromegaly present.  Cardiovascular: Normal rate, regular rhythm, normal heart sounds and intact distal pulses.  Exam reveals no gallop and no friction rub.   No murmur heard. Pulmonary/Chest: Effort normal. No accessory muscle usage. Not tachypneic. No respiratory distress. She has no decreased breath sounds. She has no wheezes. She has no rhonchi. She has no rales.  Abdominal: Soft. Bowel sounds are normal. She exhibits no distension and no mass. There is no tenderness. There is no rebound and no guarding.  Musculoskeletal: Normal range of motion. She exhibits no edema and no tenderness.  Lymphadenopathy:    She has no cervical adenopathy.  Neurological: She is alert and oriented to person, place, and time. No cranial nerve deficit. Coordination normal.  Skin: Skin is warm and dry. No rash noted. She is not diaphoretic. No erythema. No pallor.  Psychiatric: She has a normal mood and affect. Her behavior is normal. Judgment and thought content normal.  Assessment & Plan:   Problem List Items Addressed This Visit     Unprioritized   Obesity, unspecified      Wt Readings from Last 3 Encounters:  02/15/14 229 lb 8 oz (104.101 kg)  11/14/13 226 lb (102.513 kg)    08/30/13 228 lb (103.42 kg)   Body mass index is 38.19 kg/(m^2). Encouraged healthy diet and exercise. Will set up referral to discuss options for bariatric surgery.    Relevant Orders      Ambulatory referral to General Surgery   Obstructive sleep apnea     Unable to tolerate CPAP in the past because of claustrophobia. Will set up evaluation with sleep specialist.    Relevant Orders      Ambulatory referral to Sleep Studies   Restless legs syndrome (RLS)     Persistent symptoms on Requip. Will change to Mirapex. She will email with update and follow up in 4 weeks.    Relevant Medications      Pramipexole Dihydrochloride 0.375 MG TB24   Routine general medical examination at a health care facility - Primary     General medical exam normal today. Breast and pelvic deferred as completed by her OB. Mammogram UTD and reviewed with pt. Immunizations UTD. Will check labs, CBC, CMP, lipids, TSH, A1c. Encouraged healthy diet and weight loss.    Relevant Orders      CBC with Differential      Comprehensive metabolic panel      Lipid panel      Microalbumin / creatinine urine ratio      Vit D  25 hydroxy (rtn osteoporosis monitoring)      TSH      Hemoglobin A1c       Return in about 4 weeks (around 03/15/2014) for Recheck.

## 2014-02-15 NOTE — Progress Notes (Signed)
Pre visit review using our clinic review tool, if applicable. No additional management support is needed unless otherwise documented below in the visit note. 

## 2014-02-16 ENCOUNTER — Telehealth: Payer: Self-pay | Admitting: Internal Medicine

## 2014-02-16 ENCOUNTER — Encounter: Payer: Self-pay | Admitting: Internal Medicine

## 2014-02-16 NOTE — Telephone Encounter (Signed)
cxr normal

## 2014-02-22 ENCOUNTER — Other Ambulatory Visit: Payer: Self-pay | Admitting: Internal Medicine

## 2014-02-22 NOTE — Telephone Encounter (Signed)
Last refill 6.4.15, last OV 7.1.15, future OV 8.13.15.  Please advise refill.

## 2014-03-01 ENCOUNTER — Encounter: Payer: Self-pay | Admitting: Internal Medicine

## 2014-03-08 ENCOUNTER — Encounter: Payer: Self-pay | Admitting: Internal Medicine

## 2014-03-23 ENCOUNTER — Encounter: Payer: Self-pay | Admitting: Internal Medicine

## 2014-03-30 ENCOUNTER — Ambulatory Visit: Payer: 59 | Admitting: Internal Medicine

## 2014-05-02 ENCOUNTER — Encounter: Payer: Self-pay | Admitting: Internal Medicine

## 2014-05-02 ENCOUNTER — Encounter: Payer: Self-pay | Admitting: *Deleted

## 2014-05-02 ENCOUNTER — Ambulatory Visit (INDEPENDENT_AMBULATORY_CARE_PROVIDER_SITE_OTHER): Payer: 59 | Admitting: Internal Medicine

## 2014-05-02 VITALS — BP 100/70 | HR 88 | Temp 98.2°F | Ht 65.0 in | Wt 231.5 lb

## 2014-05-02 DIAGNOSIS — R059 Cough, unspecified: Secondary | ICD-10-CM

## 2014-05-02 DIAGNOSIS — G4733 Obstructive sleep apnea (adult) (pediatric): Secondary | ICD-10-CM

## 2014-05-02 DIAGNOSIS — R05 Cough: Secondary | ICD-10-CM

## 2014-05-02 DIAGNOSIS — J309 Allergic rhinitis, unspecified: Secondary | ICD-10-CM

## 2014-05-02 DIAGNOSIS — E669 Obesity, unspecified: Secondary | ICD-10-CM

## 2014-05-02 MED ORDER — TRIAMCINOLONE ACETONIDE 55 MCG/ACT NA AERO: 2.0000 | INHALATION_SPRAY | Freq: Every day | NASAL | Status: DC

## 2014-05-02 NOTE — Patient Instructions (Signed)
Restart Nasacort.  Call if cough symptoms are persistent.

## 2014-05-02 NOTE — Progress Notes (Signed)
Pre visit review using our clinic review tool, if applicable. No additional management support is needed unless otherwise documented below in the visit note. 

## 2014-05-02 NOTE — Progress Notes (Signed)
Subjective:    Patient ID: Michele Rivera, female    DOB: 21-Nov-1970, 43 y.o.   MRN: [id number]  HPI 43YO female presents for follow up.  Sleep study scheduled for 05/26/2008 at [facility]. Continues to feel tired during the day.  Started process with [facility]. Attended a seminar with Dr. Duke Salvia. Walking dog several times daily. Protein shake in morning, unsweet tea. Lunch typically salad with chicken or [place] burger or wrap. Dinner typically chicken and veggies and rice.    Review of Systems  Constitutional: Positive for fatigue. Negative for fever, chills, appetite change and unexpected weight change.  HENT: Negative for congestion, postnasal drip, rhinorrhea, sinus pressure and sneezing.   Eyes: Negative for visual disturbance.  Respiratory: Positive for cough. Negative for shortness of breath.   Cardiovascular: Negative for chest pain and leg swelling.  Gastrointestinal: Negative for abdominal pain.  Skin: Negative for color change and rash.  Hematological: Negative for adenopathy. Does not bruise/bleed easily.  Psychiatric/Behavioral: Negative for dysphoric mood. The patient is not nervous/anxious.        Objective:    BP 100/70  Pulse 88  Temp(Src) 98.2 F (36.8 C) (Oral)  Ht 5\' 5"  (1.651 m)  Wt 231 lb 8 oz (105.008 kg)  BMI 38.52 kg/m2  SpO2 98% Physical Exam  Constitutional: She is oriented to person, place, and time. She appears well-developed and well-nourished. No distress.  HENT:  Head: Normocephalic and atraumatic.  Right Ear: External ear normal.  Left Ear: External ear normal.  Nose: Nose normal.  Mouth/Throat: Oropharynx is clear and moist. No oropharyngeal exudate.  Eyes: Conjunctivae are normal. Pupils are equal, round, and reactive to light. Right eye exhibits no discharge. Left eye exhibits no discharge. No scleral icterus.  Neck: Normal range of motion. Neck supple. No tracheal deviation present. No thyromegaly present.    Cardiovascular: Normal rate, regular rhythm, normal heart sounds and intact distal pulses.  Exam reveals no gallop and no friction rub.   No murmur heard. Pulmonary/Chest: Effort normal and breath sounds normal. No accessory muscle usage. Not tachypneic. No respiratory distress. She has no decreased breath sounds. She has no wheezes. She has no rhonchi. She has no rales. She exhibits no tenderness.  Musculoskeletal: Normal range of motion. She exhibits no edema and no tenderness.  Lymphadenopathy:    She has no cervical adenopathy.  Neurological: She is alert and oriented to person, place, and time. No cranial nerve deficit. She exhibits normal muscle tone. Coordination normal.  Skin: Skin is warm and dry. No rash noted. She is not diaphoretic. No erythema. No pallor.  Psychiatric: She has a normal mood and affect. Her behavior is normal. Judgment and thought content normal.          Assessment & Plan:   Problem List Items Addressed This Visit     Unprioritized   Allergic rhinitis   Relevant Medications      triamcinolone (NASACORT ALLERGY 24HR) 55 MCG/ACT AERO nasal inhaler   Cough     Occasional cough, likely related to seasonal allergies. Will try adding back Nasacort. Continue Flonase.    Obesity, unspecified      Wt Readings from Last 3 Encounters:  05/02/14 231 lb 8 oz (105.008 kg)  02/15/14 229 lb 8 oz (104.101 kg)  11/14/13 226 lb (102.513 kg)   Body mass index is 38.52 kg/(m^2). Encouraged healthy diet and exercise. Continue to follow up with Bariatric clinic.    Relevant Orders  Amb ref to Medical Nutrition Therapy-MNT   Obstructive sleep apnea - Primary     Follow up sleep study and CPAP titration pending.        Return in about 3 months (around 08/01/2014) for Recheck.

## 2014-05-02 NOTE — Assessment & Plan Note (Signed)
Follow up sleep study and CPAP titration pending.

## 2014-05-02 NOTE — Assessment & Plan Note (Signed)
Wt Readings from Last 3 Encounters:  05/02/14 231 lb 8 oz (105.008 kg)  02/15/14 229 lb 8 oz (104.101 kg)  11/14/13 226 lb (102.513 kg)   Body mass index is 38.52 kg/(m^2). Encouraged healthy diet and exercise. Continue to follow up with Bariatric clinic.

## 2014-05-02 NOTE — Assessment & Plan Note (Signed)
Occasional cough, likely related to seasonal allergies. Will try adding back Nasacort. Continue Flonase.

## 2014-05-26 ENCOUNTER — Ambulatory Visit: Payer: Self-pay | Admitting: Internal Medicine

## 2014-06-01 ENCOUNTER — Encounter: Payer: Self-pay | Admitting: Internal Medicine

## 2014-06-08 ENCOUNTER — Ambulatory Visit: Payer: Self-pay | Admitting: Internal Medicine

## 2014-06-15 ENCOUNTER — Encounter: Payer: Self-pay | Admitting: Internal Medicine

## 2014-06-16 ENCOUNTER — Other Ambulatory Visit: Payer: Self-pay | Admitting: Internal Medicine

## 2014-07-05 ENCOUNTER — Encounter: Payer: Self-pay | Admitting: Internal Medicine

## 2014-07-05 ENCOUNTER — Ambulatory Visit (INDEPENDENT_AMBULATORY_CARE_PROVIDER_SITE_OTHER): Payer: 59 | Admitting: Internal Medicine

## 2014-07-05 VITALS — BP 111/76 | HR 82 | Temp 98.1°F | Ht 65.0 in | Wt 233.8 lb

## 2014-07-05 DIAGNOSIS — G4733 Obstructive sleep apnea (adult) (pediatric): Secondary | ICD-10-CM

## 2014-07-05 DIAGNOSIS — H9202 Otalgia, left ear: Secondary | ICD-10-CM | POA: Insufficient documentation

## 2014-07-05 DIAGNOSIS — E669 Obesity, unspecified: Secondary | ICD-10-CM

## 2014-07-05 DIAGNOSIS — G2581 Restless legs syndrome: Secondary | ICD-10-CM

## 2014-07-05 DIAGNOSIS — H6690 Otitis media, unspecified, unspecified ear: Secondary | ICD-10-CM | POA: Insufficient documentation

## 2014-07-05 DIAGNOSIS — H6692 Otitis media, unspecified, left ear: Secondary | ICD-10-CM

## 2014-07-05 MED ORDER — OMEPRAZOLE 20 MG PO CPDR: 20.0000 mg | DELAYED_RELEASE_CAPSULE | Freq: Every day | ORAL | Status: DC

## 2014-07-05 MED ORDER — AMOXICILLIN-POT CLAVULANATE 875-125 MG PO TABS: 1.0000 | ORAL_TABLET | Freq: Two times a day (BID) | ORAL | Status: DC

## 2014-07-05 MED ORDER — PRAMIPEXOLE DIHYDROCHLORIDE 0.5 MG PO TABS: 0.5000 mg | ORAL_TABLET | Freq: Every day | ORAL | Status: DC

## 2014-07-05 NOTE — Patient Instructions (Addendum)
Increase Mirapex to 0.5mg  daily.  Start Augmentin twice daily for left ear infection.  Follow up in 4 weeks and as needed.

## 2014-07-05 NOTE — Assessment & Plan Note (Signed)
Left OM on exam. Will start Augmentin bid. Follow up recheck in 4 weeks and as needed.

## 2014-07-05 NOTE — Assessment & Plan Note (Signed)
Wt Readings from Last 3 Encounters:  07/05/14 233 lb 12 oz (106.028 kg)  05/02/14 231 lb 8 oz (105.008 kg)  02/15/14 229 lb 8 oz (104.101 kg)   Body mass index is 38.9 kg/(m^2). Encouraged healthy diet and exercise. Follow up with bariatric surgeon.

## 2014-07-05 NOTE — Assessment & Plan Note (Signed)
Tolerating CPAP well. Will continue.

## 2014-07-05 NOTE — Assessment & Plan Note (Signed)
Symptoms poorly controlled with Mirapex current dose. Will increase to 0.5mg  daily. Follow up in 4 weeks.

## 2014-07-05 NOTE — Progress Notes (Signed)
Subjective:    Patient ID: Michele Rivera, female    DOB: 1970-12-13, 43 y.o.   MRN: [id number]  HPI 43YO female presents for follow up.  Sleep apnea - CPAP titration showed severe sleep apnea. They have recommended CPAP at 5cm H2O pressure. They also recommended aggressive efforts at weight loss, goal of BMI < 30.  Wearing CPAP every night since 10/25. Sleeping about 4-5 hours per night. Snoring has resolved on CPAP. Concerned about persistent leg jerking waking her from sleep.  Left ear pain over last 2-3 weeks. Notes some drainage in ear. No fever, chills.  Wt Readings from Last 3 Encounters:  07/05/14 233 lb 12 oz (106.028 kg)  05/02/14 231 lb 8 oz (105.008 kg)  02/15/14 229 lb 8 oz (104.101 kg)   Talking with Dr. Duke Salvia about bariatric surgery. Scheduled to meet with dietician 3 times.  Review of Systems  Constitutional: Negative for fever, chills, appetite change, fatigue and unexpected weight change.  HENT: Positive for ear pain and sinus pressure. Negative for congestion, postnasal drip, rhinorrhea, sore throat, tinnitus, trouble swallowing and voice change.   Eyes: Negative for visual disturbance.  Respiratory: Negative for cough and shortness of breath.   Cardiovascular: Negative for chest pain and leg swelling.  Gastrointestinal: Negative for nausea, vomiting, abdominal pain, diarrhea and constipation.  Musculoskeletal: Negative for myalgias and arthralgias.  Skin: Negative for color change and rash.  Neurological: Negative for weakness.  Hematological: Negative for adenopathy. Does not bruise/bleed easily.  Psychiatric/Behavioral: Negative for dysphoric mood. The patient is not nervous/anxious.        Objective:    BP 111/76 mmHg  Pulse 82  Temp(Src) 98.1 F (36.7 C) (Oral)  Ht 5\' 5"  (1.651 m)  Wt 233 lb 12 oz (106.028 kg)  BMI 38.90 kg/m2  SpO2 98% Physical Exam  Constitutional: She is oriented to person, place, and time. She appears well-developed and  well-nourished. No distress.  HENT:  Head: Normocephalic and atraumatic.  Right Ear: External ear normal. A middle ear effusion is present.  Left Ear: External ear normal. Tympanic membrane is erythematous and bulging. A middle ear effusion is present.  Nose: Nose normal.  Mouth/Throat: Oropharynx is clear and moist. No oropharyngeal exudate.  Eyes: Conjunctivae are normal. Pupils are equal, round, and reactive to light. Right eye exhibits no discharge. Left eye exhibits no discharge. No scleral icterus.  Neck: Normal range of motion. Neck supple. No tracheal deviation present. No thyromegaly present.  Cardiovascular: Normal rate, regular rhythm, normal heart sounds and intact distal pulses.  Exam reveals no gallop and no friction rub.   No murmur heard. Pulmonary/Chest: Effort normal and breath sounds normal. No accessory muscle usage. No tachypnea. No respiratory distress. She has no decreased breath sounds. She has no wheezes. She has no rhonchi. She has no rales. She exhibits no tenderness.  Musculoskeletal: Normal range of motion. She exhibits no edema or tenderness.  Lymphadenopathy:    She has no cervical adenopathy.  Neurological: She is alert and oriented to person, place, and time. No cranial nerve deficit. She exhibits normal muscle tone. Coordination normal.  Skin: Skin is warm and dry. No rash noted. She is not diaphoretic. No erythema. No pallor.  Psychiatric: She has a normal mood and affect. Her behavior is normal. Judgment and thought content normal.          Assessment & Plan:   Problem List Items Addressed This Visit      Unprioritized   Obesity (  BMI 30-39.9)    Wt Readings from Last 3 Encounters:  07/05/14 233 lb 12 oz (106.028 kg)  05/02/14 231 lb 8 oz (105.008 kg)  02/15/14 229 lb 8 oz (104.101 kg)   Body mass index is 38.9 kg/(m^2). Encouraged healthy diet and exercise. Follow up with bariatric surgeon.    Obstructive sleep apnea - Primary    Tolerating  CPAP well. Will continue.    Otitis media    Left OM on exam. Will start Augmentin bid. Follow up recheck in 4 weeks and as needed.    Relevant Medications      AUGMENTIN 875-125 MG PO TABS   Restless legs syndrome (RLS)    Symptoms poorly controlled with Mirapex current dose. Will increase to 0.5mg  daily. Follow up in 4 weeks.        Return in about 4 weeks (around 08/02/2014) for Recheck.

## 2014-07-05 NOTE — Progress Notes (Signed)
Pre visit review using our clinic review tool, if applicable. No additional management support is needed unless otherwise documented below in the visit note. 

## 2014-08-01 ENCOUNTER — Ambulatory Visit: Payer: 59 | Admitting: Internal Medicine

## 2014-08-07 ENCOUNTER — Ambulatory Visit: Payer: Self-pay | Admitting: Bariatrics

## 2014-08-17 ENCOUNTER — Ambulatory Visit: Payer: 59 | Admitting: Internal Medicine

## 2014-08-18 ENCOUNTER — Ambulatory Visit: Payer: Self-pay | Admitting: Bariatrics

## 2014-09-04 ENCOUNTER — Ambulatory Visit: Payer: Self-pay | Admitting: Bariatrics

## 2014-09-04 ENCOUNTER — Ambulatory Visit: Admit: 2014-09-04 | Disposition: A | Discharge status: Home / Self Care | Payer: Self-pay | Admitting: Bariatrics

## 2014-09-06 ENCOUNTER — Encounter: Payer: Self-pay | Admitting: Internal Medicine

## 2014-09-06 LAB — BASIC METABOLIC PANEL
BUN: 23 mg/dL — AB (ref 4–21)
Creatinine: 0.8 mg/dL (ref 0.5–1.1)
Glucose: 101 mg/dL
Potassium: 4.4 mmol/L (ref 3.4–5.3)
Sodium: 139 mmol/L (ref 137–147)

## 2014-09-06 LAB — HEPATIC FUNCTION PANEL
ALT: 19 U/L (ref 7–35)
AST: 21 U/L (ref 13–35)
Alkaline Phosphatase: 87 U/L (ref 25–125)
Bilirubin, Direct: 0.11 mg/dL (ref 0.01–0.4)
Bilirubin, Total: 0.3 mg/dL

## 2014-09-06 LAB — POCT INR: INR: 1 (ref 0.9–1.1)

## 2014-09-06 LAB — CBC AND DIFFERENTIAL
HCT: 38 % (ref 36–46)
Hemoglobin: 13.6 g/dL (ref 12.0–16.0)
Neutrophils Absolute: 4 /uL
Platelets: 277 10*3/uL (ref 150–399)
WBC: 7.1 10^3/mL

## 2014-09-06 LAB — HEMOGLOBIN A1C: Hgb A1c MFr Bld: 6.5 % — AB (ref 4.0–6.0)

## 2014-09-06 LAB — PROTIME-INR: Protime: 10.6 seconds (ref 10.0–13.8)

## 2014-09-13 ENCOUNTER — Encounter: Payer: Self-pay | Admitting: Internal Medicine

## 2014-09-14 ENCOUNTER — Ambulatory Visit: Payer: Self-pay | Admitting: Internal Medicine

## 2014-09-15 ENCOUNTER — Encounter: Payer: Self-pay | Admitting: Internal Medicine

## 2014-09-15 ENCOUNTER — Ambulatory Visit (INDEPENDENT_AMBULATORY_CARE_PROVIDER_SITE_OTHER): Payer: 59 | Admitting: Internal Medicine

## 2014-09-15 VITALS — BP 118/74 | HR 96 | Temp 98.1°F | Ht 65.0 in | Wt 238.5 lb

## 2014-09-15 DIAGNOSIS — R739 Hyperglycemia, unspecified: Secondary | ICD-10-CM | POA: Insufficient documentation

## 2014-09-15 DIAGNOSIS — R7309 Other abnormal glucose: Secondary | ICD-10-CM

## 2014-09-15 DIAGNOSIS — E669 Obesity, unspecified: Secondary | ICD-10-CM

## 2014-09-15 DIAGNOSIS — G2581 Restless legs syndrome: Secondary | ICD-10-CM

## 2014-09-15 MED ORDER — OMEPRAZOLE-SODIUM BICARBONATE 20-1100 MG PO CAPS: 1.0000 | ORAL_CAPSULE | Freq: Every day | ORAL | Status: DC

## 2014-09-15 NOTE — Patient Instructions (Signed)
Follow up in 3 months

## 2014-09-15 NOTE — Assessment & Plan Note (Signed)
Wt Readings from Last 3 Encounters:  09/15/14 238 lb 8 oz (108.183 kg)  07/05/14 233 lb 12 oz (106.028 kg)  05/02/14 231 lb 8 oz (105.008 kg)   Body mass index is 39.69 kg/(m^2). Encouraged healthy diet and exercise. Bariatric surgery scheduled in 11/2014.

## 2014-09-15 NOTE — Progress Notes (Signed)
Subjective:    Patient ID: Michele Rivera, female    DOB: [birth year], 44 y.o.   MRN: [id number]  HPI 44YO female presents for follow up.  Planning for bariatric surgery in April. Cardiology evaluation and psychiatry evaluation pending. UGI was normal. CXR normal. Recent labs showed A1c 6.5%.  Feeling well. No concerns today.  Wt Readings from Last 3 Encounters:  09/15/14 238 lb 8 oz (108.183 kg)  07/05/14 233 lb 12 oz (106.028 kg)  05/02/14 231 lb 8 oz (105.008 kg)    Past medical, surgical, family and social history per today's encounter.  Review of Systems  Constitutional: Negative for fever, chills, appetite change, fatigue and unexpected weight change.  Eyes: Negative for visual disturbance.  Respiratory: Negative for shortness of breath.   Cardiovascular: Negative for chest pain and leg swelling.  Gastrointestinal: Negative for nausea, vomiting, abdominal pain, diarrhea and constipation.  Skin: Negative for color change and rash.  Hematological: Negative for adenopathy. Does not bruise/bleed easily.  Psychiatric/Behavioral: Negative for dysphoric mood. The patient is not nervous/anxious.        Objective:    BP 118/74 mmHg  Pulse 96  Temp(Src) 98.1 F (36.7 C) (Oral)  Ht 5\' 5"  (1.651 m)  Wt 238 lb 8 oz (108.183 kg)  BMI 39.69 kg/m2  SpO2 98% Physical Exam  Constitutional: She is oriented to person, place, and time. She appears well-developed and well-nourished. No distress.  HENT:  Head: Normocephalic and atraumatic.  Right Ear: Tympanic membrane and external ear normal.  Left Ear: Tympanic membrane and external ear normal.  Nose: Nose normal.  Mouth/Throat: Oropharynx is clear and moist. No oropharyngeal exudate.  Eyes: Conjunctivae are normal. Pupils are equal, round, and reactive to light. Right eye exhibits no discharge. Left eye exhibits no discharge. No scleral icterus.  Neck: Normal range of motion. Neck supple. No tracheal deviation present. No  thyromegaly present.  Cardiovascular: Normal rate, regular rhythm, normal heart sounds and intact distal pulses.  Exam reveals no gallop and no friction rub.   No murmur heard. Pulmonary/Chest: Effort normal and breath sounds normal. No accessory muscle usage. No tachypnea. No respiratory distress. She has no decreased breath sounds. She has no wheezes. She has no rhonchi. She has no rales. She exhibits no tenderness.  Musculoskeletal: Normal range of motion. She exhibits no edema or tenderness.  Lymphadenopathy:    She has no cervical adenopathy.  Neurological: She is alert and oriented to person, place, and time. No cranial nerve deficit. She exhibits normal muscle tone. Coordination normal.  Skin: Skin is warm and dry. No rash noted. She is not diaphoretic. No erythema. No pallor.  Psychiatric: She has a normal mood and affect. Her behavior is normal. Judgment and thought content normal.          Assessment & Plan:   Problem List Items Addressed This Visit      Unprioritized   Elevated blood sugar    Recent blood sugar elevated with A1c 6.5%. Will plan to repeat labs after bariatric surgery.      Obesity (BMI 30-39.9) - Primary    Wt Readings from Last 3 Encounters:  09/15/14 238 lb 8 oz (108.183 kg)  07/05/14 233 lb 12 oz (106.028 kg)  05/02/14 231 lb 8 oz (105.008 kg)   Body mass index is 39.69 kg/(m^2). Encouraged healthy diet and exercise. Bariatric surgery scheduled in 11/2014.      Restless legs syndrome (RLS)    Symptoms well controlled with Mirapex.  Will continue.          Return in about 3 months (around 12/15/2014) for Recheck.

## 2014-09-15 NOTE — Assessment & Plan Note (Signed)
Recent blood sugar elevated with A1c 6.5%. Will plan to repeat labs after bariatric surgery.

## 2014-09-15 NOTE — Assessment & Plan Note (Signed)
Symptoms well controlled with Mirapex. Will continue.

## 2014-09-15 NOTE — Progress Notes (Signed)
Pre visit review using our clinic review tool, if applicable. No additional management support is needed unless otherwise documented below in the visit note. 

## 2014-09-18 ENCOUNTER — Ambulatory Visit: Payer: Self-pay | Admitting: Bariatrics

## 2014-09-27 ENCOUNTER — Encounter: Payer: Self-pay | Admitting: *Deleted

## 2014-10-17 ENCOUNTER — Ambulatory Visit
Admit: 2014-10-17 | Disposition: A | Discharge status: Home / Self Care | Payer: Self-pay | Attending: Bariatrics | Admitting: Bariatrics

## 2014-10-20 ENCOUNTER — Other Ambulatory Visit: Payer: Self-pay | Admitting: Internal Medicine

## 2014-12-05 NOTE — Op Note (Signed)
PATIENT NAME:  Michele Rivera, Michele Rivera MR#:  [id number] DATE OF BIRTH:  1971-04-16  DATE OF PROCEDURE:  03/25/2012  PREOPERATIVE DIAGNOSIS: Menorrhagia.   POSTOPERATIVE DIAGNOSIS: Menorrhagia.   OPERATION PERFORMED:  1. Laparoscopic supracervical hysterectomy. 2. Bilateral salpingo-oophorectomy.   SURGEON: Wonda Cheng. Laurey Morale, MD   FIRST ASSISTANT: Prentice Docker, MD    OPERATIVE FINDINGS: Large bulky uterus with several small fibroids.   OPERATION: After adequate general anesthesia, the patient was prepped and draped in routine fashion. An infraumbilical incision was made through the skin and approximately 3 liters of carbon dioxide were insufflated without incident. During insufflation the uterine manipulator was placed and the bladder was drained. The laparoscope was inserted. Accessory ports were placed in the lower quadrants. The above findings were noted. The infundibulopelvic pelvic ligament on the right was grasped with the harmonic scalpel and divided. The broad ligament, suspensory ligament, ovary, tube, etc., were all serially clamped, divided, and ligated with the Harmonic scalpel removing the right tube and ovary. The remainder of the broad ligament was serially clamped and divided with the Harmonic scalpel. The uterine vessels were coagulated with bipolar forceps on several spots. A like procedure was carried out on the other side. The specimen was then removed. The cuff of the vagina was cauterized, several spots including the endocervical canal. Specimen was removed with the morcellator. All areas of surgery were inspected and found to be hemostatic. Interceed was placed over the cervical stump. The port where the morcellator was used was closed with a fascial stitch. The remainder of the incisions were closed in a routine fashion.     The patient tolerated the procedure well and left the operating room in good condition. Sponge and needle counts were said to be correct at the end of  the procedure. Estimated blood loss was 10 mL.    ____________________________ Wonda Cheng. Laurey Morale, MD pjr:drc D: 03/25/2012 08:45:48 ET T: 03/25/2012 09:16:28 ET JOB#: [id number]  cc: Wonda Cheng. Laurey Morale, MD, <Dictator> Rosina Lowenstein MD ELECTRONICALLY SIGNED 03/25/2012 11:06

## 2014-12-15 ENCOUNTER — Encounter: Payer: Self-pay | Admitting: Internal Medicine

## 2014-12-15 ENCOUNTER — Ambulatory Visit (INDEPENDENT_AMBULATORY_CARE_PROVIDER_SITE_OTHER): Payer: 59 | Admitting: Internal Medicine

## 2014-12-15 VITALS — BP 133/90 | HR 84 | Temp 98.2°F | Ht 65.0 in | Wt 249.2 lb

## 2014-12-15 DIAGNOSIS — J320 Chronic maxillary sinusitis: Secondary | ICD-10-CM | POA: Diagnosis not present

## 2014-12-15 MED ORDER — AMOXICILLIN-POT CLAVULANATE 875-125 MG PO TABS: 1.0000 | ORAL_TABLET | Freq: Two times a day (BID) | ORAL | Status: DC

## 2014-12-15 NOTE — Assessment & Plan Note (Addendum)
Scheduled for bariatric surgery next month. Reviewed labs and plan for surgery. Follow up here after surgery.

## 2014-12-15 NOTE — Patient Instructions (Addendum)
Follow up after surgery.  Start Ibuprofen 800mg  three times daily for the next few day.  Start Augmentin twice daily. Start probiotic yogurt while on this medication.  Call if symptoms aren't improving.

## 2014-12-15 NOTE — Progress Notes (Signed)
Pre visit review using our clinic review tool, if applicable. No additional management support is needed unless otherwise documented below in the visit note. 

## 2014-12-15 NOTE — Progress Notes (Signed)
Subjective:    Patient ID: Michele Rivera, female    DOB: 04-17-1971, 44 y.o.   MRN: [id number]  HPI  44YO female presents for follow up.  Obesity - Scheduled for bariatric surgery, bypass. Surgery will be performed at [facility].  Headaches - Having left sided headaches and sinus pressure and congestion on left for about 4 weeks. Bloody nasal drainage on left. Using Nasacort and Afrin with no improvement. No fever or chills. No cough. Right face/sinus feels fine.  Wt Readings from Last 3 Encounters:  12/15/14 249 lb 4 oz (113.059 kg)  09/15/14 238 lb 8 oz (108.183 kg)  07/05/14 233 lb 12 oz (106.028 kg)    Past medical, surgical, family and social history per today's encounter.  Review of Systems  Constitutional: Negative for fever, chills and unexpected weight change.  HENT: Positive for congestion, nosebleeds and sinus pressure. Negative for ear discharge, ear pain, facial swelling, hearing loss, mouth sores, postnasal drip, rhinorrhea, sneezing, sore throat, tinnitus, trouble swallowing and voice change.   Eyes: Negative for pain, discharge, redness and visual disturbance.  Respiratory: Negative for cough, chest tightness, shortness of breath, wheezing and stridor.   Cardiovascular: Negative for chest pain, palpitations and leg swelling.  Musculoskeletal: Negative for myalgias, arthralgias, neck pain and neck stiffness.  Skin: Negative for color change and rash.  Neurological: Positive for headaches. Negative for dizziness, weakness and light-headedness.  Hematological: Negative for adenopathy.       Objective:    BP 133/90 mmHg  Pulse 84  Temp(Src) 98.2 F (36.8 C) (Oral)  Ht 5\' 5"  (1.651 m)  Wt 249 lb 4 oz (113.059 kg)  BMI 41.48 kg/m2  SpO2 98% Physical Exam  Constitutional: She is oriented to person, place, and time. She appears well-developed and well-nourished. No distress.  HENT:  Head: Normocephalic and atraumatic.  Right Ear: External ear normal.  Left  Ear: External ear normal.  Nose: Mucosal edema and rhinorrhea present. Left sinus exhibits maxillary sinus tenderness.  Mouth/Throat: Oropharynx is clear and moist. No oropharyngeal exudate.  Eyes: Conjunctivae are normal. Pupils are equal, round, and reactive to light. Right eye exhibits no discharge. Left eye exhibits no discharge. No scleral icterus.  Neck: Normal range of motion. Neck supple. No tracheal deviation present. No thyromegaly present.  Cardiovascular: Normal rate, regular rhythm, normal heart sounds and intact distal pulses.  Exam reveals no gallop and no friction rub.   No murmur heard. Pulmonary/Chest: Effort normal and breath sounds normal. No respiratory distress. She has no wheezes. She has no rales. She exhibits no tenderness.  Musculoskeletal: Normal range of motion. She exhibits no edema or tenderness.  Lymphadenopathy:    She has no cervical adenopathy.  Neurological: She is alert and oriented to person, place, and time. No cranial nerve deficit. She exhibits normal muscle tone. Coordination normal.  Skin: Skin is warm and dry. No rash noted. She is not diaphoretic. No erythema. No pallor.  Psychiatric: She has a normal mood and affect. Her behavior is normal. Judgment and thought content normal.          Assessment & Plan:   Problem List Items Addressed This Visit      Unprioritized   Left maxillary sinusitis - Primary    Symptoms and exam c/w left maxillary sinusitis. Will start Augmentin. Continue Nasacort. Prn Ibuprofen. Follow up if symptoms are not improving.      Relevant Medications   amoxicillin-clavulanate (AUGMENTIN) 875-125 MG per tablet   Severe obesity (BMI >=  62)    Scheduled for bariatric surgery next month. Reviewed labs and plan for surgery. Follow up here after surgery.          Return in about 3 months (around 03/16/2015) for Recheck.

## 2014-12-15 NOTE — Assessment & Plan Note (Signed)
Symptoms and exam c/w left maxillary sinusitis. Will start Augmentin. Continue Nasacort. Prn Ibuprofen. Follow up if symptoms are not improving.

## 2015-01-02 ENCOUNTER — Encounter: Payer: Self-pay | Admitting: Internal Medicine

## 2015-02-02 ENCOUNTER — Other Ambulatory Visit: Payer: Self-pay | Admitting: Internal Medicine

## 2015-02-02 DIAGNOSIS — Z1231 Encounter for screening mammogram for malignant neoplasm of breast: Secondary | ICD-10-CM

## 2015-02-05 ENCOUNTER — Ambulatory Visit: Payer: 59

## 2015-02-05 ENCOUNTER — Encounter
Admission: RE | Admit: 2015-02-05 | Discharge: 2015-02-05 | Disposition: A | Discharge status: Home / Self Care | Payer: 59 | Source: Ambulatory Visit | Attending: Bariatrics | Admitting: Bariatrics

## 2015-02-05 DIAGNOSIS — Z888 Allergy status to other drugs, medicaments and biological substances status: Secondary | ICD-10-CM | POA: Diagnosis not present

## 2015-02-05 DIAGNOSIS — F411 Generalized anxiety disorder: Secondary | ICD-10-CM | POA: Diagnosis not present

## 2015-02-05 DIAGNOSIS — G2581 Restless legs syndrome: Secondary | ICD-10-CM | POA: Insufficient documentation

## 2015-02-05 DIAGNOSIS — Z8249 Family history of ischemic heart disease and other diseases of the circulatory system: Secondary | ICD-10-CM | POA: Insufficient documentation

## 2015-02-05 DIAGNOSIS — Z01812 Encounter for preprocedural laboratory examination: Secondary | ICD-10-CM | POA: Diagnosis present

## 2015-02-05 DIAGNOSIS — J309 Allergic rhinitis, unspecified: Secondary | ICD-10-CM | POA: Insufficient documentation

## 2015-02-05 DIAGNOSIS — G4733 Obstructive sleep apnea (adult) (pediatric): Secondary | ICD-10-CM | POA: Diagnosis not present

## 2015-02-05 DIAGNOSIS — R7309 Other abnormal glucose: Secondary | ICD-10-CM | POA: Diagnosis not present

## 2015-02-05 HISTORY — DX: Headache: R51

## 2015-02-05 HISTORY — DX: Anxiety disorder, unspecified: F41.9

## 2015-02-05 HISTORY — DX: Sleep apnea, unspecified: G47.30

## 2015-02-05 HISTORY — DX: Gastro-esophageal reflux disease without esophagitis: K21.9

## 2015-02-05 HISTORY — DX: Headache, unspecified: R51.9

## 2015-02-05 HISTORY — DX: Other specified postprocedural states: Z98.890

## 2015-02-05 HISTORY — DX: Nausea with vomiting, unspecified: R11.2

## 2015-02-05 LAB — BASIC METABOLIC PANEL
ANION GAP: 8 (ref 5–15)
BUN: 17 mg/dL (ref 6–20)
CALCIUM: 9.6 mg/dL (ref 8.9–10.3)
CO2: 28 mmol/L (ref 22–32)
Chloride: 103 mmol/L (ref 101–111)
Creatinine, Ser: 0.92 mg/dL (ref 0.44–1.00)
GFR calc Af Amer: >60 mL/min (ref >60)
Glucose, Bld: 110 mg/dL — ABNORMAL HIGH (ref 65–99)
Potassium: 3.9 mmol/L (ref 3.5–5.1)
Sodium: 139 mmol/L (ref 135–145)

## 2015-02-05 LAB — TYPE AND SCREEN
ABO/RH(D): O POS
Antibody Screen: NEGATIVE

## 2015-02-05 LAB — ABO/RH: ABO/RH(D): O POS

## 2015-02-05 NOTE — Patient Instructions (Signed)
  Your procedure is scheduled on: Tuesday 6/28 Report to Day Surgery. Medical mall Entrance To find out your arrival time please call [phone] between 1PM - 3PM on Monday 6/27.  Remember: Instructions that are not followed completely may result in serious medical risk, up to and including death, or upon the discretion of your surgeon and anesthesiologist your surgery may need to be rescheduled.    __x__ 1. Do not eat food or drink liquids after midnight. No gum chewing or hard candies.     __x__ 2. No Alcohol for 24 hours before or after surgery.   ____ 3. Bring all medications with you on the day of surgery if instructed.    __x__ 4. Notify your doctor if there is any change in your medical condition     (cold, fever, infections).     Do not wear jewelry, make-up, hairpins, clips or nail polish.  Do not wear lotions, powders, or perfumes.   Do not shave 48 hours prior to surgery. Men may shave face and neck.  Do not bring valuables to the hospital.    [facility] is not responsible for any belongings or valuables.               Contacts, dentures or bridgework may not be worn into surgery.  Leave your suitcase in the car. After surgery it may be brought to your room.  For patients admitted to the hospital, discharge time is determined by your                treatment team.   Patients discharged the day of surgery will not be allowed to drive home.   Please read over the following fact sheets that you were given:   Surgical Site Infection Prevention   __x__ Take these medicines the morning of surgery with A SIP OF WATER:    1. wellbutrin  2. fluoxetine  3.   4.  5.  6.  ____ Fleet Enema (as directed)   __x__ Use CHG Soap as directed  ____ Use inhalers on the day of surgery  ____ Stop metformin 2 days prior to surgery    ____ Take 1/2 of usual insulin dose the night before surgery and none on the morning of surgery.   ____ Stop Coumadin/Plavix/aspirin on   ____  Stop Anti-inflammatories on    __x__ Stop supplements until after surgery.  Stop B Complex  _x___ Bring C-Pap to the hospital.

## 2015-02-08 ENCOUNTER — Other Ambulatory Visit: Payer: Self-pay | Admitting: Internal Medicine

## 2015-02-13 ENCOUNTER — Inpatient Hospital Stay: Payer: 59 | Admitting: Anesthesiology

## 2015-02-13 ENCOUNTER — Inpatient Hospital Stay
Admission: RE | Admit: 2015-02-13 | Discharge: 2015-02-14 | DRG: 620 | Disposition: A | Discharge status: Home / Self Care | Payer: 59 | Source: Ambulatory Visit | Attending: Bariatrics | Admitting: Bariatrics

## 2015-02-13 ENCOUNTER — Encounter
Admission: RE | Disposition: A | Discharge status: Home / Self Care | Payer: Self-pay | Source: Ambulatory Visit | Attending: Bariatrics

## 2015-02-13 ENCOUNTER — Encounter: Payer: Self-pay | Admitting: *Deleted

## 2015-02-13 DIAGNOSIS — Z9079 Acquired absence of other genital organ(s): Secondary | ICD-10-CM | POA: Diagnosis present

## 2015-02-13 DIAGNOSIS — G4700 Insomnia, unspecified: Secondary | ICD-10-CM | POA: Diagnosis present

## 2015-02-13 DIAGNOSIS — E118 Type 2 diabetes mellitus with unspecified complications: Secondary | ICD-10-CM | POA: Diagnosis present

## 2015-02-13 DIAGNOSIS — Z9911 Dependence on respirator [ventilator] status: Secondary | ICD-10-CM | POA: Diagnosis not present

## 2015-02-13 DIAGNOSIS — Z6838 Body mass index (BMI) 38.0-38.9, adult: Secondary | ICD-10-CM | POA: Diagnosis not present

## 2015-02-13 DIAGNOSIS — G4730 Sleep apnea, unspecified: Secondary | ICD-10-CM | POA: Diagnosis present

## 2015-02-13 DIAGNOSIS — Z79899 Other long term (current) drug therapy: Secondary | ICD-10-CM

## 2015-02-13 DIAGNOSIS — M2550 Pain in unspecified joint: Secondary | ICD-10-CM | POA: Diagnosis present

## 2015-02-13 DIAGNOSIS — I8390 Asymptomatic varicose veins of unspecified lower extremity: Secondary | ICD-10-CM | POA: Diagnosis present

## 2015-02-13 DIAGNOSIS — R51 Headache: Secondary | ICD-10-CM | POA: Diagnosis present

## 2015-02-13 DIAGNOSIS — F17200 Nicotine dependence, unspecified, uncomplicated: Secondary | ICD-10-CM | POA: Diagnosis present

## 2015-02-13 DIAGNOSIS — G4733 Obstructive sleep apnea (adult) (pediatric): Secondary | ICD-10-CM | POA: Diagnosis present

## 2015-02-13 DIAGNOSIS — K219 Gastro-esophageal reflux disease without esophagitis: Secondary | ICD-10-CM | POA: Diagnosis present

## 2015-02-13 HISTORY — PX: GASTRIC ROUX-EN-Y: SHX5262

## 2015-02-13 SURGERY — LAPAROSCOPIC ROUX-EN-Y GASTRIC BYPASS WITH UPPER ENDOSCOPY
Anesthesia: General | Wound class: Contaminated

## 2015-02-13 MED ORDER — FLUOXETINE HCL 20 MG PO CAPS
20.0000 mg | ORAL_CAPSULE | Freq: Every day | ORAL | Status: DC
  Filled 2015-02-13 (×2): qty 1

## 2015-02-13 MED ORDER — ROCURONIUM BROMIDE 100 MG/10ML IV SOLN
INTRAVENOUS | Status: DC | PRN
  Administered 2015-02-13: 10 mg via INTRAVENOUS
  Administered 2015-02-13: 40 mg via INTRAVENOUS
  Administered 2015-02-13 (×2): 10 mg via INTRAVENOUS

## 2015-02-13 MED ORDER — FENTANYL CITRATE (PF) 100 MCG/2ML IJ SOLN
INTRAMUSCULAR | Status: AC
  Administered 2015-02-13: 25 ug via INTRAVENOUS
  Filled 2015-02-13: qty 2

## 2015-02-13 MED ORDER — POTASSIUM CHLORIDE IN NACL 20-0.45 MEQ/L-% IV SOLN
INTRAVENOUS | Status: DC
  Administered 2015-02-13 – 2015-02-14 (×3): via INTRAVENOUS
  Filled 2015-02-13 (×7): qty 1000

## 2015-02-13 MED ORDER — ACETAMINOPHEN 160 MG/5ML PO SOLN
650.0000 mg | ORAL | Status: DC | PRN
  Filled 2015-02-13: qty 20.3

## 2015-02-13 MED ORDER — ONDANSETRON HCL 4 MG/2ML IJ SOLN
4.0000 mg | Freq: Once | INTRAMUSCULAR | Status: AC | PRN
  Administered 2015-02-13: 4 mg via INTRAVENOUS

## 2015-02-13 MED ORDER — MIDAZOLAM HCL 2 MG/2ML IJ SOLN
INTRAMUSCULAR | Status: DC | PRN
  Administered 2015-02-13: 2 mg via INTRAVENOUS

## 2015-02-13 MED ORDER — HYDROMORPHONE HCL 1 MG/ML IJ SOLN
INTRAMUSCULAR | Status: AC
  Administered 2015-02-13: 0.25 mg via INTRAVENOUS
  Filled 2015-02-13: qty 1

## 2015-02-13 MED ORDER — MORPHINE SULFATE 2 MG/ML IJ SOLN
2.0000 mg | INTRAMUSCULAR | Status: DC | PRN
  Administered 2015-02-13: 2 mg via INTRAVENOUS
  Filled 2015-02-13: qty 1
  Filled 2015-02-13: qty 2
  Filled 2015-02-13 (×2): qty 1
  Filled 2015-02-13 (×2): qty 2

## 2015-02-13 MED ORDER — BUPIVACAINE-EPINEPHRINE (PF) 0.25% -1:200000 IJ SOLN
INTRAMUSCULAR | Status: DC | PRN
  Administered 2015-02-13: 60 mL via PERINEURAL

## 2015-02-13 MED ORDER — PRAMIPEXOLE DIHYDROCHLORIDE 0.25 MG PO TABS
0.5000 mg | ORAL_TABLET | Freq: Every day | ORAL | Status: DC
  Filled 2015-02-13: qty 2

## 2015-02-13 MED ORDER — MORPHINE SULFATE 2 MG/ML IJ SOLN
2.0000 mg | INTRAMUSCULAR | Status: DC | PRN
  Administered 2015-02-13 (×3): 4 mg via INTRAVENOUS
  Administered 2015-02-14 (×2): 2 mg via INTRAVENOUS

## 2015-02-13 MED ORDER — PROMETHAZINE HCL 25 MG/ML IJ SOLN
6.2500 mg | Freq: Once | INTRAMUSCULAR | Status: AC
  Administered 2015-02-13: 6.25 mg via INTRAVENOUS

## 2015-02-13 MED ORDER — SUCCINYLCHOLINE CHLORIDE 20 MG/ML IJ SOLN
INTRAMUSCULAR | Status: DC | PRN
  Administered 2015-02-13: 100 mg via INTRAVENOUS

## 2015-02-13 MED ORDER — PROMETHAZINE HCL 25 MG/ML IJ SOLN
INTRAMUSCULAR | Status: AC
  Administered 2015-02-13: 6.25 mg via INTRAVENOUS
  Filled 2015-02-13: qty 1

## 2015-02-13 MED ORDER — OXYCODONE HCL 5 MG/5ML PO SOLN: 5.0000 mg | ORAL | Status: DC | PRN

## 2015-02-13 MED ORDER — DIPHENHYDRAMINE HCL 50 MG/ML IJ SOLN
INTRAMUSCULAR | Status: DC | PRN
  Administered 2015-02-13: 25 mg via INTRAVENOUS

## 2015-02-13 MED ORDER — LIDOCAINE HCL (CARDIAC) 20 MG/ML IV SOLN
INTRAVENOUS | Status: DC | PRN
  Administered 2015-02-13: 100 mg via INTRAVENOUS

## 2015-02-13 MED ORDER — VANCOMYCIN HCL 1000 MG IV SOLR
1000.0000 mg | INTRAVENOUS | Status: DC | PRN
  Administered 2015-02-13: 1000 mg via INTRAVENOUS

## 2015-02-13 MED ORDER — BUPROPION HCL 100 MG PO TABS
100.0000 mg | ORAL_TABLET | Freq: Three times a day (TID) | ORAL | Status: DC
  Filled 2015-02-13 (×4): qty 1

## 2015-02-13 MED ORDER — ACETAMINOPHEN 160 MG/5ML PO SOLN: 650.0000 mg | ORAL | Status: DC | PRN

## 2015-02-13 MED ORDER — CEFAZOLIN SODIUM-DEXTROSE 2-3 GM-% IV SOLR
2.0000 g | Freq: Once | INTRAVENOUS | Status: AC
  Administered 2015-02-13: 2 g via INTRAVENOUS

## 2015-02-13 MED ORDER — ONDANSETRON HCL 4 MG/2ML IJ SOLN: 4.0000 mg | INTRAMUSCULAR | Status: DC | PRN

## 2015-02-13 MED ORDER — ACETAMINOPHEN 10 MG/ML IV SOLN
INTRAVENOUS | Status: AC
  Filled 2015-02-13: qty 100

## 2015-02-13 MED ORDER — DEXMEDETOMIDINE HCL IN NACL 200 MCG/50ML IV SOLN
INTRAVENOUS | Status: DC | PRN
  Administered 2015-02-13: 12 ug via INTRAVENOUS

## 2015-02-13 MED ORDER — ENOXAPARIN SODIUM 30 MG/0.3ML ~~LOC~~ SOLN
30.0000 mg | Freq: Two times a day (BID) | SUBCUTANEOUS | Status: DC
  Filled 2015-02-13: qty 0.3

## 2015-02-13 MED ORDER — SODIUM CHLORIDE 0.9 % IR SOLN
Status: DC | PRN
  Administered 2015-02-13: 300 mL

## 2015-02-13 MED ORDER — ENOXAPARIN SODIUM 30 MG/0.3ML ~~LOC~~ SOLN: 30.0000 mg | Freq: Two times a day (BID) | SUBCUTANEOUS | Status: DC

## 2015-02-13 MED ORDER — ACETAMINOPHEN 10 MG/ML IV SOLN
1000.0000 mg | Freq: Once | INTRAVENOUS | Status: AC
  Administered 2015-02-13: 1000 mg via INTRAVENOUS

## 2015-02-13 MED ORDER — ONDANSETRON HCL 4 MG/2ML IJ SOLN
INTRAMUSCULAR | Status: AC
  Administered 2015-02-13: 4 mg via INTRAVENOUS
  Filled 2015-02-13: qty 2

## 2015-02-13 MED ORDER — UNJURY CHICKEN SOUP POWDER: 2.0000 [oz_av] | Freq: Four times a day (QID) | ORAL | Status: DC

## 2015-02-13 MED ORDER — SODIUM CHLORIDE 0.9 % IJ SOLN
INTRAMUSCULAR | Status: AC
Start: 2015-02-13 — End: 2015-02-13
  Filled 2015-02-13: qty 10

## 2015-02-13 MED ORDER — SCOPOLAMINE 1 MG/3DAYS TD PT72
MEDICATED_PATCH | TRANSDERMAL | Status: AC
  Administered 2015-02-13: 1.5 mg
  Filled 2015-02-13: qty 1

## 2015-02-13 MED ORDER — LACTATED RINGERS IV SOLN
INTRAVENOUS | Status: DC | PRN
  Administered 2015-02-13: 07:00:00 via INTRAVENOUS

## 2015-02-13 MED ORDER — SCOPOLAMINE 1 MG/3DAYS TD PT72: 1.0000 | MEDICATED_PATCH | TRANSDERMAL | Status: DC

## 2015-02-13 MED ORDER — SUGAMMADEX SODIUM 200 MG/2ML IV SOLN
INTRAVENOUS | Status: DC | PRN
  Administered 2015-02-13: 210.2 mg via INTRAVENOUS

## 2015-02-13 MED ORDER — BUPIVACAINE-EPINEPHRINE (PF) 0.25% -1:200000 IJ SOLN
INTRAMUSCULAR | Status: AC
  Filled 2015-02-13: qty 60

## 2015-02-13 MED ORDER — UNJURY CHOCOLATE CLASSIC POWDER: 2.0000 [oz_av] | Freq: Four times a day (QID) | ORAL | Status: DC

## 2015-02-13 MED ORDER — VANCOMYCIN HCL 1000 MG IV SOLR
INTRAVENOUS | Status: AC
  Filled 2015-02-13: qty 1000

## 2015-02-13 MED ORDER — ACETAMINOPHEN 160 MG/5ML PO SOLN
325.0000 mg | ORAL | Status: DC | PRN
  Filled 2015-02-13: qty 20.3

## 2015-02-13 MED ORDER — UNJURY VANILLA POWDER: 2.0000 [oz_av] | Freq: Four times a day (QID) | ORAL | Status: DC

## 2015-02-13 MED ORDER — FENTANYL CITRATE (PF) 100 MCG/2ML IJ SOLN
INTRAMUSCULAR | Status: DC | PRN
  Administered 2015-02-13: 100 ug via INTRAVENOUS
  Administered 2015-02-13: 50 ug via INTRAVENOUS
  Administered 2015-02-13: 150 ug via INTRAVENOUS
  Administered 2015-02-13: 50 ug via INTRAVENOUS

## 2015-02-13 MED ORDER — HYDROMORPHONE HCL 1 MG/ML IJ SOLN
0.2500 mg | INTRAMUSCULAR | Status: DC | PRN
  Administered 2015-02-13 (×2): 0.25 mg via INTRAVENOUS

## 2015-02-13 MED ORDER — ACETAMINOPHEN 160 MG/5ML PO SOLN: 325.0000 mg | ORAL | Status: DC | PRN

## 2015-02-13 MED ORDER — OXYCODONE HCL 5 MG/5ML PO SOLN
5.0000 mg | ORAL | Status: DC | PRN
  Administered 2015-02-14: 5 mg via ORAL
  Filled 2015-02-13: qty 5

## 2015-02-13 MED ORDER — CEFAZOLIN SODIUM-DEXTROSE 2-3 GM-% IV SOLR
INTRAVENOUS | Status: AC
  Administered 2015-02-13: 2 g via INTRAVENOUS
  Filled 2015-02-13: qty 50

## 2015-02-13 MED ORDER — PROPOFOL 10 MG/ML IV BOLUS
INTRAVENOUS | Status: DC | PRN
  Administered 2015-02-13: 170 mg via INTRAVENOUS

## 2015-02-13 MED ORDER — SODIUM CHLORIDE 0.9 % IJ SOLN
INTRAMUSCULAR | Status: AC
Start: 2015-02-13 — End: 2015-02-13
  Filled 2015-02-13: qty 3

## 2015-02-13 MED ORDER — LABETALOL HCL 5 MG/ML IV SOLN
INTRAVENOUS | Status: DC | PRN
  Administered 2015-02-13 (×2): 10 mg via INTRAVENOUS

## 2015-02-13 MED ORDER — ONDANSETRON HCL 4 MG/2ML IJ SOLN
INTRAMUSCULAR | Status: DC | PRN
  Administered 2015-02-13 (×2): 4 mg via INTRAVENOUS

## 2015-02-13 MED ORDER — DEXAMETHASONE SODIUM PHOSPHATE 4 MG/ML IJ SOLN
INTRAMUSCULAR | Status: DC | PRN
  Administered 2015-02-13: 10 mg via INTRAVENOUS

## 2015-02-13 MED ORDER — SODIUM CHLORIDE 0.9 % IV SOLN
INTRAVENOUS | Status: DC
  Administered 2015-02-13 (×2): via INTRAVENOUS

## 2015-02-13 MED ORDER — FENTANYL CITRATE (PF) 100 MCG/2ML IJ SOLN
25.0000 ug | INTRAMUSCULAR | Status: DC | PRN
  Administered 2015-02-13 (×4): 25 ug via INTRAVENOUS

## 2015-02-13 MED ORDER — SODIUM CHLORIDE 0.9 % IV SOLN
INTRAVENOUS | Status: DC
  Administered 2015-02-13: 07:00:00 via INTRAVENOUS

## 2015-02-13 MED ORDER — UNJURY CHICKEN SOUP POWDER
2.0000 [oz_av] | Freq: Three times a day (TID) | ORAL | Status: DC
  Administered 2015-02-13: 2 [oz_av] via ORAL

## 2015-02-13 SURGICAL SUPPLY — 50 items
APPLIER CLIP 5 13 M/L LIGAMAX5 (MISCELLANEOUS)
APPLIER CLIP ROT 13.4 12 LRG (CLIP)
BANDAGE ELASTIC 6 CLIP NS LF (GAUZE/BANDAGES/DRESSINGS) ×4 IMPLANT
BLADE SURG SZ11 CARB STEEL (BLADE) ×2 IMPLANT
CANISTER SUCT 1200ML W/VALVE (MISCELLANEOUS) ×2 IMPLANT
CATH TRAY 16F METER LATEX (MISCELLANEOUS) ×2 IMPLANT
CHLORAPREP W/TINT 26ML (MISCELLANEOUS) ×4 IMPLANT
CLIP APPLIE 5 13 M/L LIGAMAX5 (MISCELLANEOUS) IMPLANT
CLIP APPLIE ROT 13.4 12 LRG (CLIP) IMPLANT
DEFOGGER SCOPE WARMER CLEARIFY (MISCELLANEOUS) ×2 IMPLANT
DRAPE UTILITY 15X26 TOWEL STRL (DRAPES) ×4 IMPLANT
FILTER LAP SMOKE EVAC STRL (MISCELLANEOUS) ×2 IMPLANT
GLOVE BIO SURGEON STRL SZ7 (GLOVE) ×18 IMPLANT
GOWN STRL REUS W/ TWL LRG LVL3 (GOWN DISPOSABLE) ×3 IMPLANT
GOWN STRL REUS W/ TWL XL LVL3 (GOWN DISPOSABLE) ×1 IMPLANT
GOWN STRL REUS W/TWL LRG LVL3 (GOWN DISPOSABLE) ×3
GOWN STRL REUS W/TWL XL LVL3 (GOWN DISPOSABLE) ×1
GRASPER SUT TROCAR 14GX15 (MISCELLANEOUS) ×2 IMPLANT
IRRIGATION STRYKERFLOW (MISCELLANEOUS) ×1 IMPLANT
IRRIGATOR STRYKERFLOW (MISCELLANEOUS) ×2
IV NS 1000ML (IV SOLUTION) ×1
IV NS 1000ML BAXH (IV SOLUTION) ×1 IMPLANT
KIT RM TURNOVER STRD PROC AR (KITS) ×2 IMPLANT
LABEL OR SOLS (LABEL) ×2 IMPLANT
LIQUID BAND (GAUZE/BANDAGES/DRESSINGS) ×2 IMPLANT
NDL SAFETY 22GX1.5 (NEEDLE) ×2 IMPLANT
NS IRRIG 500ML POUR BTL (IV SOLUTION) ×2 IMPLANT
PACK LAP CHOLECYSTECTOMY (MISCELLANEOUS) ×2 IMPLANT
RELOAD BLUE (STAPLE) ×2 IMPLANT
RELOAD STAPLER GOLD 60MM (STAPLE) ×3 IMPLANT
RELOAD STAPLER WHITE 60MM (STAPLE) ×3 IMPLANT
SHEARS HARMONIC ACE PLUS 45CM (MISCELLANEOUS) ×2 IMPLANT
SLEEVE ENDOPATH XCEL 5M (ENDOMECHANICALS) ×6 IMPLANT
SLEEVE GASTRECTOMY 36FR VISIGI (MISCELLANEOUS) ×2 IMPLANT
STAPLER ECHELON LONG 60 440 (INSTRUMENTS) ×2 IMPLANT
STAPLER RELOAD GOLD 60MM (STAPLE) ×6
STAPLER RELOAD WHITE 60MM (STAPLE) ×6
SUT DEVICE BRAIDED 0X39 (SUTURE) IMPLANT
SUT DEVICE BRAIDED 2.0X39 (SUTURE) ×10 IMPLANT
SUT DVC VICRYL PGA 2.0X39 (SUTURE) ×6 IMPLANT
SUT MNCRL 4-0 (SUTURE) ×1
SUT MNCRL 4-0 27XMFL (SUTURE) ×1
SUT MNCRL AB 4-0 PS2 18 (SUTURE) ×2 IMPLANT
SUT VIC AB 0 SH 27 (SUTURE) ×2 IMPLANT
SUTURE MNCRL 4-0 27XMF (SUTURE) ×1 IMPLANT
SYR 20CC LL (SYRINGE) ×2 IMPLANT
TROCAR XCEL 12X100 BLDLESS (ENDOMECHANICALS) ×2 IMPLANT
TROCAR XCEL NON-BLD 5MMX100MML (ENDOMECHANICALS) ×2 IMPLANT
TUBING INSUFFLATOR HEATED (MISCELLANEOUS) ×2 IMPLANT
WATER STERILE IRR 1000ML POUR (IV SOLUTION) ×2 IMPLANT

## 2015-02-13 NOTE — Anesthesia Postprocedure Evaluation (Signed)
  Anesthesia Post-op Note  Patient: Michele Rivera  Procedure(s) Performed: Procedure(s): LAPAROSCOPIC ROUX-EN-Y GASTRIC BYPASS WITH UPPER ENDOSCOPY (N/A)  Anesthesia type:General ETT  Patient location: PACU  Post pain: Pain level controlled  Post assessment: Post-op Vital signs reviewed, Patient's Cardiovascular Status Stable, Respiratory Function Stable, Patent Airway and No signs of Nausea or vomiting  Post vital signs: Reviewed and stable  Last Vitals:  Filed Vitals:   02/13/15 1345  BP: 148/80  Pulse: 81  Temp: 36.7 C  Resp: 17    Level of consciousness: awake, alert  and patient cooperative  Complications: No apparent anesthesia complications

## 2015-02-13 NOTE — Progress Notes (Signed)
INTERVENTION:  RD consulted for nutrition education regarding inpatient bariatric surgery.   RD provided "The Liquid Diet" handout from the Bariatric Surgery Guide from the [facility] . This handout previously provided to patient prior to surgery is a duplicate copy. Discussed what foods/liquids are consistent with a Clear Liquid Diet and reinforced Key Concepts such as no carbonation, no caffeine, or sugar containing beverages. Provided methods to prevent dehydration and promote protein intake, using clock and sample fluid schedule. RD encouraged follow-up with outpatient dietitian after discharge.  Teach back method used.  Expect good compliance.  NUTRITION DIAGNOSIS:  Food and nutrition knowledge related deficit related to recent bariatric surgery as evidenced by dietitian consult for nutrition education   GOAL:  Patient will be able to sip and tolerate CL within 24-48 hours  MONITOR:  Energy intake Digestive system  ASSESSMENT:  Pt s/p roux-en-y gastric bypass.  Past Medical History  Diagnosis Date  . Restless legs   . PONV (postoperative nausea and vomiting)     following spinal for c section  . Sleep apnea   . Anxiety   . GERD (gastroesophageal reflux disease)   . Headache     Body mass index is 38.57 kg/(m^2).   Current diet order is bariatric clear liquids with unjury supplement TID, patient is consuming approximately sips at this time.   Labs and medications reviewed.   LOW Care Level  Michele Rivera, [place], [place] (pager)

## 2015-02-13 NOTE — Progress Notes (Signed)
Spoke to Dr.tyner about pt taking pills tonight. He said i may crush her wellbutrin but to hold her mirapex.

## 2015-02-13 NOTE — H&P (Signed)
History and physical on paper chart. No change in history or physical

## 2015-02-13 NOTE — Anesthesia Preprocedure Evaluation (Addendum)
Anesthesia Evaluation  Patient identified by MRN, date of birth, ID band Patient awake    Reviewed: Allergy & Precautions, H&P , NPO status , Patient's Chart, lab work & pertinent test results, reviewed documented beta blocker date and time   History of Anesthesia Complications (+) PONV and history of anesthetic complications  Airway Mallampati: II  TM Distance: >3 FB Neck ROM: full    Dental   Pulmonary sleep apnea , Current Smoker,          Cardiovascular Rate:Normal     Neuro/Psych  Headaches,    GI/Hepatic GERD-  ,  Endo/Other    Renal/GU      Musculoskeletal   Abdominal   Peds  Hematology   Anesthesia Other Findings   Reproductive/Obstetrics                             Anesthesia Physical Anesthesia Plan  ASA: III  Anesthesia Plan: General ETT   Post-op Pain Management:    Induction:   Airway Management Planned:   Additional Equipment:   Intra-op Plan:   Post-operative Plan:   Informed Consent: I have reviewed the patients History and Physical, chart, labs and discussed the procedure including the risks, benefits and alternatives for the proposed anesthesia with the patient or authorized representative who has indicated his/her understanding and acceptance.     Plan Discussed with: CRNA  Anesthesia Plan Comments:         Anesthesia Quick Evaluation

## 2015-02-13 NOTE — Transfer of Care (Signed)
Immediate Anesthesia Transfer of Care Note  Patient: Michele Rivera  Procedure(s) Performed: Procedure(s): LAPAROSCOPIC ROUX-EN-Y GASTRIC BYPASS WITH UPPER ENDOSCOPY (N/A)  Patient Location: PACU  Anesthesia Type:General  Level of Consciousness: awake, alert  and oriented  Airway & Oxygen Therapy: Patient Spontanous Breathing and Patient connected to face mask oxygen  Post-op Assessment: Report given to RN and Post -op Vital signs reviewed and stable  Post vital signs: Reviewed and stable  Last Vitals:  Filed Vitals:   02/13/15 0612  BP: 150/100  Pulse: 106  Temp: 36.9 C  Resp: 16    Complications: No apparent anesthesia complications

## 2015-02-13 NOTE — Interval H&P Note (Signed)
History and Physical Interval Note:  02/13/2015 7:33 AM  Michele Rivera  has presented today for surgery, with the diagnosis of MORBID OBESITY  The various methods of treatment have been discussed with the patient and family. After consideration of risks, benefits and other options for treatment, the patient has consented to  Procedure(s): LAPAROSCOPIC ROUX-EN-Y GASTRIC BYPASS WITH UPPER ENDOSCOPY (N/A) as a surgical intervention .  The patient's history has been reviewed, patient examined, no change in status, stable for surgery.  I have reviewed the patient's chart and labs.  Questions were answered to the patient's satisfaction.     Ladora Daniel

## 2015-02-13 NOTE — Op Note (Signed)
PATIENT: Michele Rivera 1971-07-21  PROCEDURE PERFORMED: laparoscopic gastric bypass  PRE-OP DIAGNOSIS: MORBID OBESITY POST-OP DIAGNOSIS: morbid obesity with mulitple comorbidities  ESTIMATED BLOOD LOSS: less than 50 mL   SURGEON: Ladora Daniel  ASSISTANT: Liliane Bade, PA   PROCEDURE IN DETAIL:The patient was brought to the operating room,  placed in a supine position, general anesthesia obtained with orotracheal intubation. Foley catheter inserted sterilely. TED hose and Thrombo-Gards applied. A foot board applied at the end of the operative bed. The chest and abdomen were sterilely prepped and draped. A 5 mm Optiview trocar  introduced in the left upper quadrant of the abdomen under direct visualization. Pneumoperitoneum obtained with carbon dioxide. Four additional trocars were introduced across the upper abdomen. The omentum was bisected in the region of the mid transverse colon. The ligament of Treitz was identified and the bowel followed distally 50 cm at which point it was secured to the inferior margin of the stomach. A Nathanson liver retractor introduced through a subxiphoid defect and used to elevate the left lobe of the liver revealing the abscense of a hiatal hernia.  The patient then had division of the vascular pedicles immediately adjacent to the lesser curvature of the stomach beginning approximately 6 cm inferior to the GE junction. This was extended superiorly over a distance of approximately 1 cm revealing the lesser sac. A series of gold load GIA staplers were then used to create a proximal gastric pouch, first firing placed in a transverse direction followed by a vertical line of staples brought out just lateral to the angle of His. This was done with a 30 [place] ViSiGi device deployed in the upper stomach to be used as an aid in sizing of the gastric pouch. Next, the mobilized jejunum was brought up and secured to the posterior aspect of the gastric pouch.  An enterotomy  was then made in the distal posterior aspect of the gastric pouch and opposing portion of the mobilized jejunum. A blue load stapler was used to create a common lumen fired at the 2.5 cm mark. The resulting enterotomy closed with a running 2-0 Polysorb suture. The suture and staple line then reinforced with an additional running Polysorb suture. The latter  performed with a 18 [place] ViSiGi directed through the area of the anastomosis and used as an aid in sizing of the anastomotic lumen. The bowel was divided immediately proximal to this, creating in effect a biliary limb.  There was a partial division of the mesentery. A Roux limb was marked at 125 cm at which point a side-to-side biliary limb to common channel limb anastomosis was created. This was accomplished with enterotomies on the antimesenteric border of these 2 portions of bowel and a white load stapler  used to create a common lumen with a device fired at 3 cm mark. The resulting enterotomy closed with a repeat firing of white load GI stapler. Anti torsion sutures placed distally, the mesenteric window closed with a running 2-0 Surgidac suture. Petersen defect closed in a similar fashion. The bowel was occluded distal to the gastrojejunal anastomosis and insufflation  performed, and a saline bath  performed. No air leak identified in this anastomotic region. The divided limbs of the omentum were secured over the area of the gastrojejunal anastomosis. The pneumoperitoneum  relieved, the trocars removed, the wounds  injected with 0.25% Marcaine and closed with 4-0 Monocryl in the dermis followed by Dermabond. Patient  allowed to recover at this point having tolerated the procedure  well.

## 2015-02-14 LAB — COMPREHENSIVE METABOLIC PANEL
ALT: 69 U/L — ABNORMAL HIGH (ref 14–54)
ANION GAP: 6 (ref 5–15)
AST: 54 U/L — ABNORMAL HIGH (ref 15–41)
Albumin: 3.8 g/dL (ref 3.5–5.0)
Alkaline Phosphatase: 70 U/L (ref 38–126)
BUN: 11 mg/dL (ref 6–20)
CALCIUM: 8.1 mg/dL — AB (ref 8.9–10.3)
CO2: 24 mmol/L (ref 22–32)
Chloride: 107 mmol/L (ref 101–111)
Creatinine, Ser: 0.81 mg/dL (ref 0.44–1.00)
GFR calc Af Amer: >60 mL/min (ref >60)
GFR calc non Af Amer: >60 mL/min (ref >60)
Glucose, Bld: 86 mg/dL (ref 65–99)
Potassium: 4 mmol/L (ref 3.5–5.1)
Sodium: 137 mmol/L (ref 135–145)
TOTAL PROTEIN: 6.9 g/dL (ref 6.5–8.1)
Total Bilirubin: 0.7 mg/dL (ref 0.3–1.2)

## 2015-02-14 LAB — CBC WITH DIFFERENTIAL/PLATELET
Basophils Absolute: 0 10*3/uL (ref 0–0.1)
Basophils Relative: 0 %
Eosinophils Absolute: 0 10*3/uL (ref 0–0.7)
Eosinophils Relative: 0 %
HEMATOCRIT: 39.1 % (ref 35.0–47.0)
HEMOGLOBIN: 13 g/dL (ref 12.0–16.0)
LYMPHS PCT: 22 %
Lymphs Abs: 3.2 10*3/uL (ref 1.0–3.6)
MCH: 28.8 pg (ref 26.0–34.0)
MCHC: 33.2 g/dL (ref 32.0–36.0)
MCV: 86.8 fL (ref 80.0–100.0)
MONO ABS: 1.3 10*3/uL — AB (ref 0.2–0.9)
Monocytes Relative: 9 %
Neutro Abs: 9.8 10*3/uL — ABNORMAL HIGH (ref 1.4–6.5)
Neutrophils Relative %: 69 %
PLATELETS: 244 10*3/uL (ref 150–440)
RBC: 4.51 MIL/uL (ref 3.80–5.20)
RDW: 13.6 % (ref 11.5–14.5)
WBC: 14.3 10*3/uL — ABNORMAL HIGH (ref 3.6–11.0)

## 2015-02-14 MED ORDER — BUPROPION HCL 100 MG PO TABS: 100.0000 mg | ORAL_TABLET | Freq: Three times a day (TID) | ORAL | Status: DC

## 2015-02-14 NOTE — Progress Notes (Signed)
Alert and oriented. Vss. No signs of acute distress. Discharge instructions given patient verbalized understanding.

## 2015-02-14 NOTE — Discharge Instructions (Signed)
°  Bariatric Surgery, Care After Refer to this sheet in the next few weeks. These instructions provide you with information on caring for yourself after your procedure. Your health care provider may also give you more specific instructions. Your treatment has been planned according to current medical practices, but problems sometimes occur. Call your health care provider if you have any problems or questions after your procedure. WHAT TO EXPECT AFTER THE PROCEDURE After your procedure, it is typical to have the following sensations:  Soreness.  Sluggishness.  Tiredness.  Moodiness.  Chilliness. It is not unusual to have dry skin and some hair loss after bariatric surgery. HOME CARE INSTRUCTIONS  Do not drink alcohol, use public transportation, or sign important papers for at least 1 day following surgery.  Do not resume physical activities or drive until directed by your surgeon.  Avoid lifting anything over 10 pounds (4.5 kg) for 6 weeks following surgery, or until approved by your surgeon.   Only take over-the-counter or prescription medicines for pain, discomfort, or fever as directed by your surgeon.   Resume your diet as directed by your surgeon. You will resume eating with liquids and pureed foods, then soft foods, and progress to a more normal diet over time. Follow your surgeon or dietitian's guidelines for what and how much to eat and drink. You will need to eat slowly, so as not to cause discomfort and vomiting.  Use showers for bathing as directed by your surgeon.   Change dressings as directed by your surgeon.  Schedule a follow-up appointment with your surgeon as directed. SEEK MEDICAL CARE IF:   There is redness, swelling, or increasing pain in the wound.   There is pus coming from the wound.   There is drainage from the wound lasting longer than 1 day.   An unexplained oral temperature above 102F (38.9C) develops.   You notice a foul smell coming from  the wound or dressing.   There is a breaking open of the wound (edges not staying together) after stitches have been removed.   You notice increasing pain in the shoulder-strap areas.   You develop episodes of dizziness or faint while standing.   You develop shortness of breath.   You develop persistent nausea or vomiting.   Your soreness seems to be getting worse rather than better.  SEEK IMMEDIATE MEDICAL CARE IF:   You develop a rash.   You have difficulty breathing.   You develop, or feel you are developing, any reaction or side effects to medicines. FOR MORE INFORMATION American Society for Bariatric Surgery: www.asbs.org Weight-control Information Network (WIN): win.AmenCredit.is Document Released: 08/04/2005 Document Revised: 12/19/2013 Document Reviewed: 02/02/2013 [facility] Patient Information 2015 [place], [place]. This information is not intended to replace advice given to you by your health care provider. Make sure you discuss any questions you have with your health care provider.

## 2015-02-14 NOTE — Discharge Summary (Signed)
Physician Discharge Summary  Patient ID: Michele Rivera MRN: [id number] DOB/AGE: 12/21/1970 44 y.o.  Admit date: 02/13/2015 Discharge date: 02/14/2015  Admission Diagnoses:morbid obesity  Discharge Diagnoses: same Active Problems:   Morbid obesity    Procedure(s): LAPAROSCOPIC ROUX-EN-Y GASTRIC BYPASS WITH UPPER ENDOSCOPY (N/A)  Discharged Condition: good  Hospital Course: tolerated operative procedure well, ambulating and taking po well. Pain well controlled  Consults: None  Significant Diagnostic Studies: none  Treatments: IV hydration  Discharge Exam: Blood pressure 121/81, pulse 69, temperature 98.2 F (36.8 C), temperature source Oral, resp. rate 16, weight 104.781 kg (231 lb), SpO2 94 %. Resp: clear to auscultation bilaterally, abdomen benign  Disposition: Final discharge disposition not confirmed  Discharge Instructions    Ambulate hourly while awake    Complete by:  As directed      Call MD for:  difficulty breathing, headache or visual disturbances    Complete by:  As directed      Call MD for:  persistant dizziness or light-headedness    Complete by:  As directed      Call MD for:  persistant nausea and vomiting    Complete by:  As directed      Call MD for:  redness, tenderness, or signs of infection (pain, swelling, redness, odor or green/yellow discharge around incision site)    Complete by:  As directed      Call MD for:  severe uncontrolled pain    Complete by:  As directed      Call MD for:  temperature >101 F    Complete by:  As directed      Diet bariatric full liquid    Complete by:  As directed      Discharge instructions    Complete by:  As directed   D/c to home if takes po well and pain controlled with oral meds. F/U in my office as scheduled.     Discharge wound care:    Complete by:  As directed   Shower prn     Incentive spirometry    Complete by:  As directed   Perform hourly while awake     No dressing needed    Complete by:   As directed             Medication List    STOP taking these medications        buPROPion 300 MG 24 hr tablet  Commonly known as:  WELLBUTRIN XL  Replaced by:  buPROPion 100 MG tablet     ranitidine 150 MG tablet  Commonly known as:  ZANTAC      TAKE these medications        B COMPLEX-VITAMIN B12 PO  Place 1 mL under the tongue 4 (four) times daily.     buPROPion 100 MG tablet  Commonly known as:  WELLBUTRIN  Take 1 tablet (100 mg total) by mouth 3 (three) times daily.     FLUoxetine 20 MG capsule  Commonly known as:  PROZAC  TAKE 1 CAPSULE BY MOUTH ONCE DAILY     oxymetazoline 0.05 % nasal spray  Commonly known as:  AFRIN  Place 1 spray into both nostrils at bedtime as needed for congestion. Use 4 days stop for 5 days repeat     pramipexole 0.5 MG tablet  Commonly known as:  MIRAPEX  TAKE 1 TABLET BY MOUTH AT BEDTIME.     triamcinolone 55 MCG/ACT Aero nasal inhaler  Commonly known as:  NASACORT  ALLERGY 24HR  Place 2 sprays into the nose daily.     Vitamin D3 2000 UNITS Tabs  Take 2 tablets by mouth daily.         Signed: Ladora Daniel 02/14/2015, 8:04 AM

## 2015-02-14 NOTE — Progress Notes (Signed)
Pt has been compliant with bariatric diet while awake and ambulating around nursing station when awake as well. Given pain meds 2x thus far. Foley to be removed at [place] sites with dermabond c/d/i.

## 2015-03-05 ENCOUNTER — Ambulatory Visit: Payer: 59

## 2015-03-16 ENCOUNTER — Ambulatory Visit: Payer: 59 | Admitting: Internal Medicine

## 2015-03-19 ENCOUNTER — Ambulatory Visit: Payer: Self-pay | Admitting: Internal Medicine

## 2015-03-19 ENCOUNTER — Telehealth: Payer: Self-pay | Admitting: Internal Medicine

## 2015-03-19 NOTE — Telephone Encounter (Signed)
Pt called to cancel appt for today due to working. Pt has already rescheduled appt. Please advise to cancel appt for today/msn

## 2015-03-19 NOTE — Telephone Encounter (Signed)
Please see below.

## 2015-03-19 NOTE — Telephone Encounter (Signed)
Fine to cancel appt.

## 2015-03-22 ENCOUNTER — Encounter: Payer: Self-pay | Admitting: Internal Medicine

## 2015-03-22 ENCOUNTER — Ambulatory Visit (INDEPENDENT_AMBULATORY_CARE_PROVIDER_SITE_OTHER): Payer: 59 | Admitting: Internal Medicine

## 2015-03-22 DIAGNOSIS — F411 Generalized anxiety disorder: Secondary | ICD-10-CM | POA: Diagnosis not present

## 2015-03-22 DIAGNOSIS — G2581 Restless legs syndrome: Secondary | ICD-10-CM | POA: Diagnosis not present

## 2015-03-22 MED ORDER — BUPROPION HCL 75 MG PO TABS: 75.0000 mg | ORAL_TABLET | Freq: Two times a day (BID) | ORAL | Status: DC

## 2015-03-22 MED ORDER — PRAMIPEXOLE DIHYDROCHLORIDE 0.25 MG PO TABS: 0.2500 mg | ORAL_TABLET | Freq: Every day | ORAL | Status: DC | PRN

## 2015-03-22 NOTE — Progress Notes (Signed)
Subjective:    Patient ID: Michele Rivera, female    DOB: [birth year], 44 y.o.   MRN: [id number]  HPI  44YO female presents for follow up.  S/p gastric bypass 6/28  Wt Readings from Last 3 Encounters:  03/22/15 210 lb 2 oz (95.312 kg)  02/13/15 231 lb (104.781 kg)  02/05/15 239 lb (108.41 kg)   Feeling good.Has lost 39lb total. Stopped taking all medications. Notes some anxiety at work. Would like to start back on Wellbutrin, however nervous about size of pills.   Past medical, surgical, family and social history per today's encounter.   Review of Systems  Constitutional: Negative for fever, chills, appetite change, fatigue and unexpected weight change.  Eyes: Negative for visual disturbance.  Respiratory: Negative for shortness of breath.   Cardiovascular: Negative for chest pain and leg swelling.  Gastrointestinal: Negative for nausea, vomiting, abdominal pain, diarrhea and constipation.  Musculoskeletal: Negative for myalgias and arthralgias.  Skin: Negative for color change and rash.  Hematological: Negative for adenopathy. Does not bruise/bleed easily.  Psychiatric/Behavioral: Negative for sleep disturbance and dysphoric mood. The patient is nervous/anxious.        Objective:    BP 98/58 mmHg  Pulse 64  Temp(Src) 97.5 F (36.4 C) (Oral)  Resp 18  Ht 5\' 5"  (1.651 m)  Wt 210 lb 2 oz (95.312 kg)  BMI 34.97 kg/m2  SpO2 97% Physical Exam  Constitutional: She is oriented to person, place, and time. She appears well-developed and well-nourished. No distress.  HENT:  Head: Normocephalic and atraumatic.  Right Ear: External ear normal.  Left Ear: External ear normal.  Nose: Nose normal.  Mouth/Throat: Oropharynx is clear and moist. No oropharyngeal exudate.  Eyes: Conjunctivae are normal. Pupils are equal, round, and reactive to light. Right eye exhibits no discharge. Left eye exhibits no discharge. No scleral icterus.  Neck: Normal range of motion. Neck  supple. No tracheal deviation present. No thyromegaly present.  Cardiovascular: Normal rate, regular rhythm, normal heart sounds and intact distal pulses.  Exam reveals no gallop and no friction rub.   No murmur heard. Pulmonary/Chest: Effort normal and breath sounds normal. No respiratory distress. She has no wheezes. She has no rales. She exhibits no tenderness.  Abdominal: Normal appearance and bowel sounds are normal. There is no tenderness.    Musculoskeletal: Normal range of motion. She exhibits no edema or tenderness.  Lymphadenopathy:    She has no cervical adenopathy.  Neurological: She is alert and oriented to person, place, and time. No cranial nerve deficit. She exhibits normal muscle tone. Coordination normal.  Skin: Skin is warm and dry. No rash noted. She is not diaphoretic. No erythema. No pallor.  Psychiatric: She has a normal mood and affect. Her speech is normal and behavior is normal. Judgment and thought content normal.          Assessment & Plan:   Problem List Items Addressed This Visit      Unprioritized   Anxiety state    Will start back Wellbutrin 75mg  daily as tolerated. She will discuss with pharmacist re potential options for size of tablet.      Relevant Medications   buPROPion (WELLBUTRIN) 75 MG tablet   Restless legs syndrome (RLS)    Will add 0.25mg  Mirapex daily prn. Follow up in 4 weeks.      Severe obesity (BMI >= 40) - Primary    Wt Readings from Last 3 Encounters:  03/22/15 210 lb 2 oz (95.312 kg)  02/13/15 231 lb (104.781 kg)  02/05/15 239 lb (108.41 kg)   Body mass index is 34.97 kg/(m^2). Congratulated pt on weight loss. Continue healthy diet and exercise. Follow up in 4 weeks.          Return in about 4 weeks (around 04/19/2015) for Recheck.

## 2015-03-22 NOTE — Assessment & Plan Note (Signed)
Will add 0.25mg  Mirapex daily prn. Follow up in 4 weeks.

## 2015-03-22 NOTE — Assessment & Plan Note (Signed)
Will start back Wellbutrin 75mg  daily as tolerated. She will discuss with pharmacist re potential options for size of tablet.

## 2015-03-22 NOTE — Progress Notes (Signed)
Pre visit review using our clinic review tool, if applicable. No additional management support is needed unless otherwise documented below in the visit note.   Patient is taking a baratric multivitamin and vitamin b12 sublingual, calcium chewable three times a day 500.   Not swallowing any pills at this point.

## 2015-03-22 NOTE — Patient Instructions (Signed)
Start Wellbutrin 75mg  every morning. May add second dose in the evening if tolerated.  Start Mirapex 0.25mg  at bedtime as needed.

## 2015-03-22 NOTE — Assessment & Plan Note (Signed)
Wt Readings from Last 3 Encounters:  03/22/15 210 lb 2 oz (95.312 kg)  02/13/15 231 lb (104.781 kg)  02/05/15 239 lb (108.41 kg)   Body mass index is 34.97 kg/(m^2). Congratulated pt on weight loss. Continue healthy diet and exercise. Follow up in 4 weeks.

## 2015-04-11 ENCOUNTER — Other Ambulatory Visit: Payer: Self-pay | Admitting: Internal Medicine

## 2015-04-11 ENCOUNTER — Ambulatory Visit
Admission: RE | Admit: 2015-04-11 | Discharge: 2015-04-11 | Disposition: A | Discharge status: Home / Self Care | Payer: 59 | Source: Ambulatory Visit | Attending: Internal Medicine | Admitting: Internal Medicine

## 2015-04-11 DIAGNOSIS — Z1231 Encounter for screening mammogram for malignant neoplasm of breast: Secondary | ICD-10-CM

## 2015-05-30 ENCOUNTER — Ambulatory Visit: Payer: 59 | Admitting: Internal Medicine

## 2015-07-26 ENCOUNTER — Encounter: Payer: Self-pay | Admitting: Internal Medicine

## 2015-08-10 ENCOUNTER — Other Ambulatory Visit
Admission: RE | Admit: 2015-08-10 | Discharge: 2015-08-10 | Disposition: A | Discharge status: Home / Self Care | Payer: 59 | Source: Ambulatory Visit | Attending: Bariatrics | Admitting: Bariatrics

## 2015-08-10 DIAGNOSIS — K912 Postsurgical malabsorption, not elsewhere classified: Secondary | ICD-10-CM | POA: Diagnosis not present

## 2015-08-10 DIAGNOSIS — Z9884 Bariatric surgery status: Secondary | ICD-10-CM | POA: Insufficient documentation

## 2015-08-10 LAB — COMPREHENSIVE METABOLIC PANEL
ALBUMIN: 4.3 g/dL (ref 3.5–5.0)
ALK PHOS: 65 U/L (ref 38–126)
ALT: 17 U/L (ref 14–54)
AST: 21 U/L (ref 15–41)
Anion gap: 7 (ref 5–15)
BUN: 16 mg/dL (ref 6–20)
CHLORIDE: 108 mmol/L (ref 101–111)
CO2: 25 mmol/L (ref 22–32)
CREATININE: 0.7 mg/dL (ref 0.44–1.00)
Calcium: 9.3 mg/dL (ref 8.9–10.3)
GFR calc non Af Amer: >60 mL/min (ref >60)
GLUCOSE: 98 mg/dL (ref 65–99)
Potassium: 3.9 mmol/L (ref 3.5–5.1)
SODIUM: 140 mmol/L (ref 135–145)
Total Bilirubin: 0.5 mg/dL (ref 0.3–1.2)
Total Protein: 7.3 g/dL (ref 6.5–8.1)

## 2015-08-10 LAB — CBC WITH DIFFERENTIAL/PLATELET
BASOS ABS: 0 10*3/uL (ref 0–0.1)
BASOS PCT: 1 %
EOS ABS: 0.1 10*3/uL (ref 0–0.7)
EOS PCT: 2 %
HCT: 41 % (ref 35.0–47.0)
Hemoglobin: 13.9 g/dL (ref 12.0–16.0)
LYMPHS PCT: 40 %
Lymphs Abs: 2 10*3/uL (ref 1.0–3.6)
MCH: 29.6 pg (ref 26.0–34.0)
MCHC: 33.9 g/dL (ref 32.0–36.0)
MCV: 87.5 fL (ref 80.0–100.0)
MONO ABS: 0.7 10*3/uL (ref 0.2–0.9)
Monocytes Relative: 15 %
Neutro Abs: 2.1 10*3/uL (ref 1.4–6.5)
Neutrophils Relative %: 42 %
PLATELETS: 180 10*3/uL (ref 150–440)
RBC: 4.69 MIL/uL (ref 3.80–5.20)
RDW: 12.7 % (ref 11.5–14.5)
WBC: 4.9 10*3/uL (ref 3.6–11.0)

## 2015-08-10 LAB — TSH: TSH: 1.116 u[IU]/mL (ref 0.350–4.500)

## 2015-08-10 LAB — MAGNESIUM: Magnesium: 2.1 mg/dL (ref 1.7–2.4)

## 2015-08-10 LAB — FERRITIN: Ferritin: 40 ng/mL (ref 11–307)

## 2015-08-10 LAB — PREALBUMIN: PREALBUMIN: 20.4 mg/dL (ref 18–38)

## 2015-08-10 LAB — VITAMIN B12: VITAMIN B 12: 562 pg/mL (ref 180–914)

## 2015-08-10 LAB — FOLATE: FOLATE: 34 ng/mL (ref >5.9)

## 2015-08-10 LAB — IRON: Iron: 27 ug/dL — ABNORMAL LOW (ref 28–170)

## 2015-08-10 LAB — HEMOGLOBIN A1C: HEMOGLOBIN A1C: 5.4 % (ref 4.0–6.0)

## 2015-08-14 LAB — MISC LABCORP TEST (SEND OUT): LABCORP TEST CODE: 121200

## 2015-08-14 LAB — PTH, INTACT AND CALCIUM
Calcium, Total (PTH): 9.4 mg/dL (ref 8.7–10.2)
PTH: 19 pg/mL (ref 15–65)

## 2015-08-14 LAB — VITAMIN D 25 HYDROXY (VIT D DEFICIENCY, FRACTURES): VIT D 25 HYDROXY: 44.4 ng/mL (ref 30.0–100.0)

## 2015-08-14 LAB — VITAMIN E: Alpha-Tocopherol: 11.8 mg/L (ref 5.3–16.8)

## 2015-08-14 LAB — COPPER, SERUM: COPPER: 105 ug/dL (ref 72–166)

## 2015-08-14 LAB — VITAMIN B1: VITAMIN B1 (THIAMINE): 139.6 nmol/L (ref 66.5–200.0)

## 2015-08-14 LAB — ZINC: Zinc: 76 ug/dL (ref 56–134)

## 2015-08-14 LAB — VITAMIN A: Vitamin A (Retinoic Acid): 37 ug/dL (ref 20–65)

## 2015-09-10 DIAGNOSIS — Z01419 Encounter for gynecological examination (general) (routine) without abnormal findings: Secondary | ICD-10-CM | POA: Diagnosis not present

## 2015-09-10 DIAGNOSIS — Z1239 Encounter for other screening for malignant neoplasm of breast: Secondary | ICD-10-CM | POA: Diagnosis not present

## 2015-09-14 ENCOUNTER — Ambulatory Visit (INDEPENDENT_AMBULATORY_CARE_PROVIDER_SITE_OTHER): Payer: 59 | Admitting: Internal Medicine

## 2015-09-14 ENCOUNTER — Encounter: Payer: Self-pay | Admitting: Internal Medicine

## 2015-09-14 VITALS — BP 97/68 | HR 62 | Temp 98.0°F | Ht 65.0 in | Wt 155.2 lb

## 2015-09-14 DIAGNOSIS — G4733 Obstructive sleep apnea (adult) (pediatric): Secondary | ICD-10-CM | POA: Diagnosis not present

## 2015-09-14 DIAGNOSIS — F411 Generalized anxiety disorder: Secondary | ICD-10-CM

## 2015-09-14 MED ORDER — BUPROPION HCL 75 MG PO TABS: 75.0000 mg | ORAL_TABLET | Freq: Two times a day (BID) | ORAL | Status: DC

## 2015-09-14 NOTE — Progress Notes (Signed)
Pre visit review using our clinic review tool, if applicable. No additional management support is needed unless otherwise documented below in the visit note. 

## 2015-09-14 NOTE — Assessment & Plan Note (Signed)
Symptoms of situational anxiety at work. Discussed restarting Wellbutrin. Will start 75mg  in the morning. She is looking for a new job. Will continue to monitor.

## 2015-09-14 NOTE — Patient Instructions (Signed)
Physical exam in 02/2016.  We will set up a sleep study.  Start back on Wellbutrin 75mg  daily in the morning. May increase to twice daily if needed.

## 2015-09-14 NOTE — Assessment & Plan Note (Signed)
Will set up follow up sleep study to recheck for sleep apnea given nearly 100lb weight loss.

## 2015-09-14 NOTE — Progress Notes (Signed)
Subjective:    Patient ID: Michele Rivera, female    DOB: [birth year], 45 y.o.   MRN: [id number]  HPI  45YO female presents for follow up.  Generally feeling well. Has lost over 90lbs after bariatric surgery. No NV. Continues to eat very small portions.  No recent illness.  Having some knee pain with walking up stairs. Notes grinding in knees. More pain with changes in weather. Not taking anything for this.  Continues to have some anxiety. Exacerbated by work. Does well at home. Looking for a new job.  OSA - Insurance monitors usage of CPAP. Has not been using. Needs repeat  Wt Readings from Last 3 Encounters:  09/14/15 155 lb 4 oz (70.421 kg)  03/22/15 210 lb 2 oz (95.312 kg)  02/13/15 231 lb (104.781 kg)   BP Readings from Last 3 Encounters:  09/14/15 97/68  03/22/15 98/58  02/14/15 121/81    Past Medical History  Diagnosis Date  . Restless legs   . PONV (postoperative nausea and vomiting)     following spinal for c section  . Sleep apnea   . Anxiety   . GERD (gastroesophageal reflux disease)   . Headache    Family History  Problem Relation Age of Onset  . Hypertension Mother   . Vision loss Mother   . Alcohol abuse Father   . Kidney disease Son   . Heart disease Maternal Grandmother    Past Surgical History  Procedure Laterality Date  . Cesarean section  1999    2002   . Abdominal hysterectomy  2013    Dr. Arsenio Loader, for menorrhagia  . Gastric roux-en-y N/A 02/13/2015    Procedure: LAPAROSCOPIC ROUX-EN-Y GASTRIC BYPASS WITH UPPER ENDOSCOPY;  Surgeon: Ladora Daniel, MD;  Location: [facility] ORS;  Service: General;  Laterality: N/A;   Social History   Social History  . Marital Status: Married    Spouse Name: N/A  . Number of Children: N/A  . Years of Education: N/A   Social History Main Topics  . Smoking status: Never Smoker   . Smokeless tobacco: Never Used  . Alcohol Use: No  . Drug Use: No  . Sexual Activity: Not Asked   Other Topics Concern   . None   Social History Narrative   Lives in [place] with husband and 2 children.    Review of Systems  Constitutional: Negative for fever, chills, appetite change, fatigue and unexpected weight change.  Eyes: Negative for visual disturbance.  Respiratory: Negative for shortness of breath.   Cardiovascular: Negative for chest pain and leg swelling.  Gastrointestinal: Negative for nausea, vomiting, abdominal pain, diarrhea and constipation.  Musculoskeletal: Positive for arthralgias (bilateral knees when walking up stairs). Negative for myalgias, joint swelling and gait problem.  Skin: Negative for color change and rash.  Hematological: Negative for adenopathy. Does not bruise/bleed easily.  Psychiatric/Behavioral: Negative for suicidal ideas, sleep disturbance and dysphoric mood. The patient is nervous/anxious.        Objective:    BP 97/68 mmHg  Pulse 62  Temp(Src) 98 F (36.7 C) (Oral)  Ht 5\' 5"  (1.651 m)  Wt 155 lb 4 oz (70.421 kg)  BMI 25.83 kg/m2  SpO2 99% Physical Exam  Constitutional: She is oriented to person, place, and time. She appears well-developed and well-nourished. No distress.  HENT:  Head: Normocephalic and atraumatic.  Right Ear: External ear normal.  Left Ear: External ear normal.  Nose: Nose normal.  Mouth/Throat: Oropharynx is clear and moist.  No oropharyngeal exudate.  Eyes: Conjunctivae are normal. Pupils are equal, round, and reactive to light. Right eye exhibits no discharge. Left eye exhibits no discharge. No scleral icterus.  Neck: Normal range of motion. Neck supple. No tracheal deviation present. No thyromegaly present.  Cardiovascular: Normal rate, regular rhythm, normal heart sounds and intact distal pulses.  Exam reveals no gallop and no friction rub.   No murmur heard. Pulmonary/Chest: Effort normal and breath sounds normal. No respiratory distress. She has no wheezes. She has no rales. She exhibits no tenderness.  Musculoskeletal: She  exhibits no edema or tenderness.       Right knee: She exhibits normal range of motion and no swelling.       Left knee: She exhibits normal range of motion and no swelling.  Bilateral knee crepitus  Lymphadenopathy:    She has no cervical adenopathy.  Neurological: She is alert and oriented to person, place, and time. No cranial nerve deficit. She exhibits normal muscle tone. Coordination normal.  Skin: Skin is warm and dry. No rash noted. She is not diaphoretic. No erythema. No pallor.  Psychiatric: She has a normal mood and affect. Her behavior is normal. Judgment and thought content normal.          Assessment & Plan:   Problem List Items Addressed This Visit      Unprioritized   Anxiety state - Primary    Symptoms of situational anxiety at work. Discussed restarting Wellbutrin. Will start 75mg  in the morning. She is looking for a new job. Will continue to monitor.      Relevant Medications   buPROPion (WELLBUTRIN) 75 MG tablet   Obstructive sleep apnea    Will set up follow up sleep study to recheck for sleep apnea given nearly 100lb weight loss.      Relevant Orders   Ambulatory referral to Sleep Studies       Return in about 6 months (around 03/13/2016) for Physical.

## 2015-10-02 DIAGNOSIS — G4733 Obstructive sleep apnea (adult) (pediatric): Secondary | ICD-10-CM | POA: Diagnosis not present

## 2015-10-04 ENCOUNTER — Telehealth: Payer: Self-pay | Admitting: Internal Medicine

## 2015-10-04 ENCOUNTER — Encounter: Payer: Self-pay | Admitting: *Deleted

## 2015-10-04 NOTE — Telephone Encounter (Signed)
Recent sleep study showed mild sleep apnea. They recommended auto-titrating CPAP. Does she have this in her home? I can write new Rx for this if needed.

## 2015-10-04 NOTE — Telephone Encounter (Signed)
Sent My-Chart message

## 2015-10-24 ENCOUNTER — Encounter: Payer: Self-pay | Admitting: Internal Medicine

## 2015-11-27 ENCOUNTER — Ambulatory Visit: Payer: Self-pay | Admitting: Physician Assistant

## 2015-11-27 ENCOUNTER — Encounter: Payer: Self-pay | Admitting: Physician Assistant

## 2015-11-27 VITALS — BP 100/70 | HR 81 | Temp 98.0°F

## 2015-11-27 DIAGNOSIS — J069 Acute upper respiratory infection, unspecified: Secondary | ICD-10-CM

## 2015-11-27 LAB — POCT INFLUENZA A/B
Influenza A, POC: NEGATIVE
Influenza B, POC: NEGATIVE

## 2015-11-27 MED ORDER — AZITHROMYCIN 250 MG PO TABS: ORAL_TABLET | ORAL | Status: AC

## 2015-11-27 NOTE — Progress Notes (Signed)
S: C/o runny nose and congestion for 3 days, no fever, chills, cp/sob, v/d; mucus was green this am but clear throughout the day, cough is sporadic, mucus has gotten really thick and has bad taste, denies body aches  Using otc meds: robitussin  O: PE: vitals wnl, nad, perrl eomi, normocephalic, tms dull, nasal mucosa red and swollen, throat injected, neck supple no lymph, lungs c t a, cv rrr, neuro intact, flu swab neg  A:  Acute uri   P: zpack, drink fluids, continue regular meds , use otc meds of choice, return if not improving in 5 days, return earlier if worsening

## 2015-12-25 ENCOUNTER — Ambulatory Visit: Payer: Self-pay | Admitting: Internal Medicine

## 2016-02-06 DIAGNOSIS — Z5181 Encounter for therapeutic drug level monitoring: Secondary | ICD-10-CM | POA: Diagnosis not present

## 2016-02-06 DIAGNOSIS — Z9884 Bariatric surgery status: Secondary | ICD-10-CM | POA: Diagnosis not present

## 2016-02-06 DIAGNOSIS — K912 Postsurgical malabsorption, not elsewhere classified: Secondary | ICD-10-CM | POA: Diagnosis not present

## 2016-03-07 ENCOUNTER — Telehealth: Payer: Self-pay

## 2016-03-07 DIAGNOSIS — Z0000 Encounter for general adult medical examination without abnormal findings: Secondary | ICD-10-CM

## 2016-03-07 NOTE — Telephone Encounter (Signed)
Pt coming for labs 03/10/16. Please place future orders. Thank you.

## 2016-03-10 ENCOUNTER — Other Ambulatory Visit: Payer: 59

## 2016-03-10 ENCOUNTER — Other Ambulatory Visit
Admission: RE | Admit: 2016-03-10 | Discharge: 2016-03-10 | Disposition: A | Discharge status: Home / Self Care | Payer: 59 | Source: Ambulatory Visit | Attending: Physician Assistant | Admitting: Physician Assistant

## 2016-03-10 DIAGNOSIS — K912 Postsurgical malabsorption, not elsewhere classified: Secondary | ICD-10-CM | POA: Insufficient documentation

## 2016-03-10 DIAGNOSIS — Z9884 Bariatric surgery status: Secondary | ICD-10-CM | POA: Diagnosis not present

## 2016-03-10 DIAGNOSIS — Z5181 Encounter for therapeutic drug level monitoring: Secondary | ICD-10-CM | POA: Diagnosis not present

## 2016-03-10 LAB — MAGNESIUM: MAGNESIUM: 2 mg/dL (ref 1.7–2.4)

## 2016-03-10 LAB — TSH: TSH: 0.736 u[IU]/mL (ref 0.350–4.500)

## 2016-03-10 LAB — URINE DRUG SCREEN, QUALITATIVE (ARMC ONLY)
AMPHETAMINES, UR SCREEN: NOT DETECTED
Barbiturates, Ur Screen: NOT DETECTED
Benzodiazepine, Ur Scrn: NOT DETECTED
CANNABINOID 50 NG, UR ~~LOC~~: NOT DETECTED
COCAINE METABOLITE, UR ~~LOC~~: NOT DETECTED
MDMA (ECSTASY) UR SCREEN: NOT DETECTED
Methadone Scn, Ur: NOT DETECTED
Opiate, Ur Screen: NOT DETECTED
Phencyclidine (PCP) Ur S: NOT DETECTED
TRICYCLIC, UR SCREEN: NOT DETECTED

## 2016-03-10 LAB — VITAMIN B12: VITAMIN B 12: 1466 pg/mL — AB (ref 180–914)

## 2016-03-10 LAB — CBC WITH DIFFERENTIAL/PLATELET
BASOS PCT: 1 %
Basophils Absolute: 0.1 10*3/uL (ref 0–0.1)
EOS ABS: 0.3 10*3/uL (ref 0–0.7)
EOS PCT: 5 %
HCT: 41.5 % (ref 35.0–47.0)
HEMOGLOBIN: 14.3 g/dL (ref 12.0–16.0)
Lymphocytes Relative: 48 %
Lymphs Abs: 2.5 10*3/uL (ref 1.0–3.6)
MCH: 30.9 pg (ref 26.0–34.0)
MCHC: 34.4 g/dL (ref 32.0–36.0)
MCV: 89.7 fL (ref 80.0–100.0)
Monocytes Absolute: 0.3 10*3/uL (ref 0.2–0.9)
Monocytes Relative: 6 %
NEUTROS PCT: 40 %
Neutro Abs: 2.1 10*3/uL (ref 1.4–6.5)
PLATELETS: 168 10*3/uL (ref 150–440)
RBC: 4.62 MIL/uL (ref 3.80–5.20)
RDW: 12.5 % (ref 11.5–14.5)
WBC: 5.2 10*3/uL (ref 3.6–11.0)

## 2016-03-10 LAB — COMPREHENSIVE METABOLIC PANEL
ALK PHOS: 53 U/L (ref 38–126)
ALT: 20 U/L (ref 14–54)
AST: 22 U/L (ref 15–41)
Albumin: 4.4 g/dL (ref 3.5–5.0)
Anion gap: 8 (ref 5–15)
BILIRUBIN TOTAL: 0.9 mg/dL (ref 0.3–1.2)
BUN: 19 mg/dL (ref 6–20)
CALCIUM: 9.7 mg/dL (ref 8.9–10.3)
CO2: 26 mmol/L (ref 22–32)
CREATININE: 0.85 mg/dL (ref 0.44–1.00)
Chloride: 106 mmol/L (ref 101–111)
Glucose, Bld: 89 mg/dL (ref 65–99)
Potassium: 3.9 mmol/L (ref 3.5–5.1)
Sodium: 140 mmol/L (ref 135–145)
TOTAL PROTEIN: 7.2 g/dL (ref 6.5–8.1)

## 2016-03-10 LAB — LIPID PANEL
Cholesterol: 181 mg/dL (ref 0–200)
HDL: 65 mg/dL (ref >40)
LDL CALC: 107 mg/dL — AB (ref 0–99)
TRIGLYCERIDES: 46 mg/dL (ref <150)
Total CHOL/HDL Ratio: 2.8 RATIO
VLDL: 9 mg/dL (ref 0–40)

## 2016-03-10 LAB — PREALBUMIN: Prealbumin: 28.1 mg/dL (ref 18–38)

## 2016-03-10 LAB — HEMOGLOBIN A1C: HEMOGLOBIN A1C: 5.1 % (ref 4.0–6.0)

## 2016-03-10 LAB — IRON: IRON: 113 ug/dL (ref 28–170)

## 2016-03-10 LAB — FERRITIN: FERRITIN: 25 ng/mL (ref 11–307)

## 2016-03-10 LAB — FOLATE: Folate: 40 ng/mL (ref >5.9)

## 2016-03-11 LAB — PTH, INTACT AND CALCIUM
CALCIUM TOTAL (PTH): 9.2 mg/dL (ref 8.7–10.2)
PTH: 27 pg/mL (ref 15–65)

## 2016-03-11 LAB — VITAMIN D 25 HYDROXY (VIT D DEFICIENCY, FRACTURES): Vit D, 25-Hydroxy: 54.1 ng/mL (ref 30.0–100.0)

## 2016-03-12 LAB — VITAMIN B1: Vitamin B1 (Thiamine): 160.2 nmol/L (ref 66.5–200.0)

## 2016-03-12 LAB — ZINC: Zinc: 87 ug/dL (ref 56–134)

## 2016-03-12 LAB — COPPER, SERUM: COPPER: 92 ug/dL (ref 72–166)

## 2016-03-13 LAB — VITAMIN K1, SERUM: VITAMIN K1: 0.76 ng/mL (ref 0.13–1.88)

## 2016-03-13 LAB — VITAMIN E: Alpha-Tocopherol: 10.6 mg/L (ref 5.3–16.8)

## 2016-03-13 LAB — VITAMIN A: Vitamin A (Retinoic Acid): 50 ug/dL (ref 20–65)

## 2016-03-14 ENCOUNTER — Ambulatory Visit (INDEPENDENT_AMBULATORY_CARE_PROVIDER_SITE_OTHER): Payer: 59 | Admitting: Internal Medicine

## 2016-03-14 ENCOUNTER — Other Ambulatory Visit: Payer: Self-pay | Admitting: Internal Medicine

## 2016-03-14 ENCOUNTER — Encounter: Payer: Self-pay | Admitting: Internal Medicine

## 2016-03-14 VITALS — BP 114/58 | HR 79 | Ht 65.0 in | Wt 145.8 lb

## 2016-03-14 DIAGNOSIS — Z0000 Encounter for general adult medical examination without abnormal findings: Secondary | ICD-10-CM | POA: Diagnosis not present

## 2016-03-14 DIAGNOSIS — Z1239 Encounter for other screening for malignant neoplasm of breast: Secondary | ICD-10-CM | POA: Diagnosis not present

## 2016-03-14 NOTE — Progress Notes (Signed)
Pre visit review using our clinic review tool, if applicable. No additional management support is needed unless otherwise documented below in the visit note. 

## 2016-03-14 NOTE — Progress Notes (Signed)
Subjective:    Patient ID: Michele Rivera, female    DOB: [birth year], 45 y.o.   MRN: [id number]  HPI  45YO female presents for physical exam.  Feeling great. Eating small portions. Walking for exercising. Sleeping well.  No NV.   Wt Readings from Last 3 Encounters:  03/14/16 145 lb 12.8 oz (66.1 kg)  09/14/15 155 lb 4 oz (70.4 kg)  03/22/15 210 lb 2 oz (95.3 kg)   BP Readings from Last 3 Encounters:  03/14/16 (!) 114/58  11/27/15 100/70  09/14/15 97/68    Past Medical History:  Diagnosis Date  . Anxiety   . GERD (gastroesophageal reflux disease)   . Headache   . PONV (postoperative nausea and vomiting)    following spinal for c section  . Restless legs   . Sleep apnea    Family History  Problem Relation Age of Onset  . Hypertension Mother   . Vision loss Mother   . Alcohol abuse Father   . Kidney disease Son   . Heart disease Maternal Grandmother    Past Surgical History:  Procedure Laterality Date  . ABDOMINAL HYSTERECTOMY  2013   Dr. Arsenio Loader, for menorrhagia  . [place]   2002   . GASTRIC ROUX-EN-Y N/A 02/13/2015   Procedure: LAPAROSCOPIC ROUX-EN-Y GASTRIC BYPASS WITH UPPER ENDOSCOPY;  Surgeon: Ladora Daniel, MD;  Location: [facility] ORS;  Service: General;  Laterality: N/A;   Social History   Social History  . Marital status: Married    Spouse name: N/A  . Number of children: N/A  . Years of education: N/A   Social History Main Topics  . Smoking status: Never Smoker  . Smokeless tobacco: Never Used  . Alcohol use No  . Drug use: No  . Sexual activity: Not Asked   Other Topics Concern  . None   Social History Narrative   Lives in [place] with husband and 2 children.    Review of Systems  Constitutional: Negative for appetite change, chills, fatigue, fever and unexpected weight change.  Eyes: Negative for visual disturbance.  Respiratory: Negative for cough and shortness of breath.   Cardiovascular: Negative for chest  pain, palpitations and leg swelling.  Gastrointestinal: Negative for abdominal pain, constipation, diarrhea, nausea and vomiting.  Genitourinary: Negative for pelvic pain.  Musculoskeletal: Negative for arthralgias and myalgias.  Skin: Negative for color change and rash.  Hematological: Negative for adenopathy. Does not bruise/bleed easily.  Psychiatric/Behavioral: Negative for dysphoric mood and sleep disturbance. The patient is not nervous/anxious.        Objective:    BP (!) 114/58 (BP Location: Right Arm, Patient Position: Sitting, Cuff Size: Normal)   Pulse 79   Ht 5\' 5"  (1.651 m)   Wt 145 lb 12.8 oz (66.1 kg)   SpO2 96%   BMI 24.26 kg/m  Physical Exam  Constitutional: She is oriented to person, place, and time. She appears well-developed and well-nourished. No distress.  HENT:  Head: Normocephalic and atraumatic.  Right Ear: External ear normal.  Left Ear: External ear normal.  Nose: Nose normal.  Mouth/Throat: Oropharynx is clear and moist. No oropharyngeal exudate.  Eyes: Conjunctivae are normal. Pupils are equal, round, and reactive to light. Right eye exhibits no discharge. Left eye exhibits no discharge. No scleral icterus.  Neck: Normal range of motion. Neck supple. No tracheal deviation present. No thyromegaly present.  Cardiovascular: Normal rate, regular rhythm, normal heart sounds and intact distal pulses.  Exam reveals no  gallop and no friction rub.   No murmur heard. Pulmonary/Chest: Effort normal and breath sounds normal. No accessory muscle usage. No tachypnea. No respiratory distress. She has no decreased breath sounds. She has no wheezes. She has no rales. She exhibits no tenderness. Right breast exhibits no inverted nipple, no mass, no nipple discharge, no skin change and no tenderness. Left breast exhibits no inverted nipple, no mass, no nipple discharge, no skin change and no tenderness. Breasts are symmetrical.  Abdominal: Soft. Bowel sounds are normal. She  exhibits no distension and no mass. There is no tenderness. There is no rebound and no guarding.  Musculoskeletal: Normal range of motion. She exhibits no edema or tenderness.  Lymphadenopathy:    She has no cervical adenopathy.  Neurological: She is alert and oriented to person, place, and time. No cranial nerve deficit. She exhibits normal muscle tone. Coordination normal.  Skin: Skin is warm and dry. No rash noted. She is not diaphoretic. No erythema. No pallor.  Psychiatric: She has a normal mood and affect. Her behavior is normal. Judgment and thought content normal.          Assessment & Plan:   Problem List Items Addressed This Visit      Unprioritized   Routine general medical examination at a health care facility - Primary    General medical exam normal today including breast exam.  PAP and pelvic deferred as completed at [facility]. Labs reviewed. Immunizations UTD. Encourage continued healthy diet and exercise. Mammogram ordered.      Screening for breast cancer   Relevant Orders   MM Digital Diagnostic Bilat   MM DIGITAL SCREENING BILATERAL    Other Visit Diagnoses   None.      Return in about 6 months (around 09/14/2016) for New Patient.  Ronette Deter, MD Internal Medicine [facility]

## 2016-03-14 NOTE — Assessment & Plan Note (Addendum)
General medical exam normal today including breast exam.  PAP and pelvic deferred as completed at [facility]. Labs reviewed. Immunizations UTD. Encourage continued healthy diet and exercise. Mammogram ordered.

## 2016-03-14 NOTE — Patient Instructions (Signed)
Health Maintenance, Female Adopting a healthy lifestyle and getting preventive care can go a long way to promote health and wellness. Talk with your health care provider about what schedule of regular examinations is right for you. This is a good chance for you to check in with your provider about disease prevention and staying healthy. In between checkups, there are plenty of things you can do on your own. Experts have done a lot of research about which lifestyle changes and preventive measures are most likely to keep you healthy. Ask your health care provider for more information. WEIGHT AND DIET  Eat a healthy diet  Be sure to include plenty of vegetables, fruits, low-fat dairy products, and lean protein.  Do not eat a lot of foods high in solid fats, added sugars, or salt.  Get regular exercise. This is one of the most important things you can do for your health.  Most adults should exercise for at least 150 minutes each week. The exercise should increase your heart rate and make you sweat (moderate-intensity exercise).  Most adults should also do strengthening exercises at least twice a week. This is in addition to the moderate-intensity exercise.  Maintain a healthy weight  Body mass index (BMI) is a measurement that can be used to identify possible weight problems. It estimates body fat based on height and weight. Your health care provider can help determine your BMI and help you achieve or maintain a healthy weight.  For females 20 years of age and older:   A BMI below 18.5 is considered underweight.  A BMI of 18.5 to 24.9 is normal.  A BMI of 25 to 29.9 is considered overweight.  A BMI of 30 and above is considered obese.  Watch levels of cholesterol and blood lipids  You should start having your blood tested for lipids and cholesterol at 46 years of age, then have this test every 5 years.  You may need to have your cholesterol levels checked more often if:  Your lipid  or cholesterol levels are high.  You are older than 46 years of age.  You are at high risk for heart disease.  CANCER SCREENING   Lung Cancer  Lung cancer screening is recommended for adults 55-80 years old who are at high risk for lung cancer because of a history of smoking.  A yearly low-dose CT scan of the lungs is recommended for people who:  Currently smoke.  Have quit within the past 15 years.  Have at least a 30-pack-year history of smoking. A pack year is smoking an average of one pack of cigarettes a day for 1 year.  Yearly screening should continue until it has been 15 years since you quit.  Yearly screening should stop if you develop a health problem that would prevent you from having lung cancer treatment.  Breast Cancer  Practice breast self-awareness. This means understanding how your breasts normally appear and feel.  It also means doing regular breast self-exams. Let your health care provider know about any changes, no matter how small.  If you are in your 20s or 30s, you should have a clinical breast exam (CBE) by a health care provider every 1-3 years as part of a regular health exam.  If you are 40 or older, have a CBE every year. Also consider having a breast X-ray (mammogram) every year.  If you have a family history of breast cancer, talk to your health care provider about genetic screening.  If you   are at high risk for breast cancer, talk to your health care provider about having an MRI and a mammogram every year.  Breast cancer gene (BRCA) assessment is recommended for women who have family members with BRCA-related cancers. BRCA-related cancers include:  Breast.  Ovarian.  Tubal.  Peritoneal cancers.  Results of the assessment will determine the need for genetic counseling and BRCA1 and BRCA2 testing. Cervical Cancer Your health care provider may recommend that you be screened regularly for cancer of the pelvic organs (ovaries, uterus, and  vagina). This screening involves a pelvic examination, including checking for microscopic changes to the surface of your cervix (Pap test). You may be encouraged to have this screening done every 3 years, beginning at age 21.  For women ages 30-65, health care providers may recommend pelvic exams and Pap testing every 3 years, or they may recommend the Pap and pelvic exam, combined with testing for human papilloma virus (HPV), every 5 years. Some types of HPV increase your risk of cervical cancer. Testing for HPV may also be done on women of any age with unclear Pap test results.  Other health care providers may not recommend any screening for nonpregnant women who are considered low risk for pelvic cancer and who do not have symptoms. Ask your health care provider if a screening pelvic exam is right for you.  If you have had past treatment for cervical cancer or a condition that could lead to cancer, you need Pap tests and screening for cancer for at least 20 years after your treatment. If Pap tests have been discontinued, your risk factors (such as having a new sexual partner) need to be reassessed to determine if screening should resume. Some women have medical problems that increase the chance of getting cervical cancer. In these cases, your health care provider may recommend more frequent screening and Pap tests. Colorectal Cancer  This type of cancer can be detected and often prevented.  Routine colorectal cancer screening usually begins at 46 years of age and continues through 46 years of age.  Your health care provider may recommend screening at an earlier age if you have risk factors for colon cancer.  Your health care provider may also recommend using home test kits to check for hidden blood in the stool.  A small camera at the end of a tube can be used to examine your colon directly (sigmoidoscopy or colonoscopy). This is done to check for the earliest forms of colorectal  cancer.  Routine screening usually begins at age 50.  Direct examination of the colon should be repeated every 5-10 years through 46 years of age. However, you may need to be screened more often if early forms of precancerous polyps or small growths are found. Skin Cancer  Check your skin from head to toe regularly.  Tell your health care provider about any new moles or changes in moles, especially if there is a change in a mole's shape or color.  Also tell your health care provider if you have a mole that is larger than the size of a pencil eraser.  Always use sunscreen. Apply sunscreen liberally and repeatedly throughout the day.  Protect yourself by wearing long sleeves, pants, a wide-brimmed hat, and sunglasses whenever you are outside. HEART DISEASE, DIABETES, AND HIGH BLOOD PRESSURE   High blood pressure causes heart disease and increases the risk of stroke. High blood pressure is more likely to develop in:  People who have blood pressure in the high end   of the normal range (130-139/85-89 mm Hg).  People who are overweight or obese.  People who are African American.  If you are 38-23 years of age, have your blood pressure checked every 3-5 years. If you are 61 years of age or older, have your blood pressure checked every year. You should have your blood pressure measured twice--once when you are at a hospital or clinic, and once when you are not at a hospital or clinic. Record the average of the two measurements. To check your blood pressure when you are not at a hospital or clinic, you can use:  An automated blood pressure machine at a pharmacy.  A home blood pressure monitor.  If you are between 45 years and 39 years old, ask your health care provider if you should take aspirin to prevent strokes.  Have regular diabetes screenings. This involves taking a blood sample to check your fasting blood sugar level.  If you are at a normal weight and have a low risk for diabetes,  have this test once every three years after 46 years of age.  If you are overweight and have a high risk for diabetes, consider being tested at a younger age or more often. PREVENTING INFECTION  Hepatitis B  If you have a higher risk for hepatitis B, you should be screened for this virus. You are considered at high risk for hepatitis B if:  You were born in a country where hepatitis B is common. Ask your health care provider which countries are considered high risk.  Your parents were born in a high-risk country, and you have not been immunized against hepatitis B (hepatitis B vaccine).  You have HIV or AIDS.  You use needles to inject street drugs.  You live with someone who has hepatitis B.  You have had sex with someone who has hepatitis B.  You get hemodialysis treatment.  You take certain medicines for conditions, including cancer, organ transplantation, and autoimmune conditions. Hepatitis C  Blood testing is recommended for:  Everyone born from [birth year] through 1966.  Anyone with known risk factors for hepatitis C. Sexually transmitted infections (STIs)  You should be screened for sexually transmitted infections (STIs) including gonorrhea and chlamydia if:  You are sexually active and are younger than 46 years of age.  You are older than 46 years of age and your health care provider tells you that you are at risk for this type of infection.  Your sexual activity has changed since you were last screened and you are at an increased risk for chlamydia or gonorrhea. Ask your health care provider if you are at risk.  If you do not have HIV, but are at risk, it may be recommended that you take a prescription medicine daily to prevent HIV infection. This is called pre-exposure prophylaxis (PrEP). You are considered at risk if:  You are sexually active and do not regularly use condoms or know the HIV status of your partner(s).  You take drugs by injection.  You are sexually  active with a partner who has HIV. Talk with your health care provider about whether you are at high risk of being infected with HIV. If you choose to begin PrEP, you should first be tested for HIV. You should then be tested every 3 months for as long as you are taking PrEP.  PREGNANCY   If you are premenopausal and you may become pregnant, ask your health care provider about preconception counseling.  If you may  become pregnant, take 400 to 800 micrograms (mcg) of folic acid every day.  If you want to prevent pregnancy, talk to your health care provider about birth control (contraception). OSTEOPOROSIS AND MENOPAUSE   Osteoporosis is a disease in which the bones lose minerals and strength with aging. This can result in serious bone fractures. Your risk for osteoporosis can be identified using a bone density scan.  If you are 61 years of age or older, or if you are at risk for osteoporosis and fractures, ask your health care provider if you should be screened.  Ask your health care provider whether you should take a calcium or vitamin D supplement to lower your risk for osteoporosis.  Menopause may have certain physical symptoms and risks.  Hormone replacement therapy may reduce some of these symptoms and risks. Talk to your health care provider about whether hormone replacement therapy is right for you.  HOME CARE INSTRUCTIONS   Schedule regular health, dental, and eye exams.  Stay current with your immunizations.   Do not use any tobacco products including cigarettes, chewing tobacco, or electronic cigarettes.  If you are pregnant, do not drink alcohol.  If you are breastfeeding, limit how much and how often you drink alcohol.  Limit alcohol intake to no more than 1 drink per day for nonpregnant women. One drink equals 12 ounces of beer, 5 ounces of wine, or 1 ounces of hard liquor.  Do not use street drugs.  Do not share needles.  Ask your health care provider for help if  you need support or information about quitting drugs.  Tell your health care provider if you often feel depressed.  Tell your health care provider if you have ever been abused or do not feel safe at home.   This information is not intended to replace advice given to you by your health care provider. Make sure you discuss any questions you have with your health care provider.   Document Released: 02/17/2011 Document Revised: 08/25/2014 Document Reviewed: 07/06/2013 Elsevier Interactive Patient Education [employer].

## 2016-04-14 ENCOUNTER — Ambulatory Visit: Payer: 59

## 2016-05-05 ENCOUNTER — Ambulatory Visit
Admission: RE | Admit: 2016-05-05 | Discharge: 2016-05-05 | Disposition: A | Discharge status: Home / Self Care | Payer: 59 | Source: Ambulatory Visit | Attending: Internal Medicine | Admitting: Internal Medicine

## 2016-05-05 ENCOUNTER — Other Ambulatory Visit: Payer: Self-pay | Admitting: Internal Medicine

## 2016-05-05 DIAGNOSIS — Z1231 Encounter for screening mammogram for malignant neoplasm of breast: Secondary | ICD-10-CM | POA: Insufficient documentation

## 2016-05-05 DIAGNOSIS — Z1239 Encounter for other screening for malignant neoplasm of breast: Secondary | ICD-10-CM

## 2016-05-21 ENCOUNTER — Encounter: Payer: Self-pay | Admitting: Family

## 2016-08-06 DIAGNOSIS — K912 Postsurgical malabsorption, not elsewhere classified: Secondary | ICD-10-CM | POA: Diagnosis not present

## 2016-08-06 DIAGNOSIS — Z5181 Encounter for therapeutic drug level monitoring: Secondary | ICD-10-CM | POA: Diagnosis not present

## 2016-08-06 DIAGNOSIS — Z9884 Bariatric surgery status: Secondary | ICD-10-CM | POA: Diagnosis not present

## 2016-09-16 ENCOUNTER — Ambulatory Visit: Payer: Self-pay | Admitting: Family

## 2016-10-24 ENCOUNTER — Ambulatory Visit: Payer: Self-pay | Admitting: Family

## 2016-10-24 VITALS — BP 100/70 | HR 74 | Temp 98.1°F

## 2016-10-24 DIAGNOSIS — R300 Dysuria: Secondary | ICD-10-CM

## 2016-10-24 LAB — POCT URINALYSIS DIPSTICK
Bilirubin, UA: NEGATIVE
GLUCOSE UA: NEGATIVE
KETONES UA: NEGATIVE
Nitrite, UA: NEGATIVE
PROTEIN UA: NEGATIVE
SPEC GRAV UA: 1.025
Urobilinogen, UA: 0.2
pH, UA: 5.5

## 2016-10-24 MED ORDER — CIPROFLOXACIN HCL 250 MG PO TABS: 250.0000 mg | ORAL_TABLET | Freq: Two times a day (BID) | ORAL | 0 refills | Status: DC

## 2016-10-24 MED ORDER — PHENAZOPYRIDINE HCL 200 MG PO TABS: 200.0000 mg | ORAL_TABLET | Freq: Three times a day (TID) | ORAL | 0 refills | Status: DC | PRN

## 2016-10-24 NOTE — Progress Notes (Signed)
S/ 3-4 d hx frequency, dysuria,was awakened in sleep with pain last night,urine malodorous,  denies gyn c/o ,  Recent intercourse with out voiding after   O/ VSS  Abd nontender  U/A dip with trace blood and leukes  A/ dysuria P/rx sxs cipro x 3 d ,pyridium .hydration f/u prn not improving.

## 2016-12-05 ENCOUNTER — Other Ambulatory Visit
Admission: RE | Admit: 2016-12-05 | Discharge: 2016-12-05 | Disposition: A | Discharge status: Home / Self Care | Payer: 59 | Source: Ambulatory Visit | Attending: Physician Assistant | Admitting: Physician Assistant

## 2016-12-05 DIAGNOSIS — Z5181 Encounter for therapeutic drug level monitoring: Secondary | ICD-10-CM | POA: Diagnosis not present

## 2016-12-05 DIAGNOSIS — K912 Postsurgical malabsorption, not elsewhere classified: Secondary | ICD-10-CM | POA: Insufficient documentation

## 2016-12-05 DIAGNOSIS — Z9884 Bariatric surgery status: Secondary | ICD-10-CM | POA: Diagnosis not present

## 2016-12-05 LAB — COMPREHENSIVE METABOLIC PANEL
ALT: 22 U/L (ref 14–54)
AST: 24 U/L (ref 15–41)
Albumin: 4.5 g/dL (ref 3.5–5.0)
Alkaline Phosphatase: 57 U/L (ref 38–126)
Anion gap: 6 (ref 5–15)
BILIRUBIN TOTAL: 0.8 mg/dL (ref 0.3–1.2)
BUN: 21 mg/dL — ABNORMAL HIGH (ref 6–20)
CHLORIDE: 106 mmol/L (ref 101–111)
CO2: 28 mmol/L (ref 22–32)
CREATININE: 0.69 mg/dL (ref 0.44–1.00)
Calcium: 9.3 mg/dL (ref 8.9–10.3)
GFR calc non Af Amer: >60 mL/min (ref >60)
Glucose, Bld: 93 mg/dL (ref 65–99)
POTASSIUM: 3.9 mmol/L (ref 3.5–5.1)
Sodium: 140 mmol/L (ref 135–145)
TOTAL PROTEIN: 7.4 g/dL (ref 6.5–8.1)

## 2016-12-05 LAB — URINE DRUG SCREEN, QUALITATIVE (ARMC ONLY)
Amphetamines, Ur Screen: NOT DETECTED
BARBITURATES, UR SCREEN: NOT DETECTED
Benzodiazepine, Ur Scrn: NOT DETECTED
CANNABINOID 50 NG, UR ~~LOC~~: NOT DETECTED
COCAINE METABOLITE, UR ~~LOC~~: NOT DETECTED
MDMA (ECSTASY) UR SCREEN: NOT DETECTED
Methadone Scn, Ur: NOT DETECTED
OPIATE, UR SCREEN: NOT DETECTED
PHENCYCLIDINE (PCP) UR S: NOT DETECTED
Tricyclic, Ur Screen: NOT DETECTED

## 2016-12-05 LAB — LIPID PANEL
CHOL/HDL RATIO: 2.4 ratio
Cholesterol: 173 mg/dL (ref 0–200)
HDL: 72 mg/dL (ref >40)
LDL Cholesterol: 90 mg/dL (ref 0–99)
TRIGLYCERIDES: 55 mg/dL (ref <150)
VLDL: 11 mg/dL (ref 0–40)

## 2016-12-05 LAB — CBC WITH DIFFERENTIAL/PLATELET
BASOS ABS: 0.1 10*3/uL (ref 0–0.1)
Basophils Relative: 1 %
EOS ABS: 0.2 10*3/uL (ref 0–0.7)
EOS PCT: 5 %
HCT: 42.3 % (ref 35.0–47.0)
HEMOGLOBIN: 14.3 g/dL (ref 12.0–16.0)
LYMPHS ABS: 2.2 10*3/uL (ref 1.0–3.6)
LYMPHS PCT: 42 %
MCH: 30.2 pg (ref 26.0–34.0)
MCHC: 33.8 g/dL (ref 32.0–36.0)
MCV: 89.3 fL (ref 80.0–100.0)
Monocytes Absolute: 0.4 10*3/uL (ref 0.2–0.9)
Monocytes Relative: 7 %
NEUTROS PCT: 45 %
Neutro Abs: 2.4 10*3/uL (ref 1.4–6.5)
PLATELETS: 193 10*3/uL (ref 150–440)
RBC: 4.73 MIL/uL (ref 3.80–5.20)
RDW: 12.8 % (ref 11.5–14.5)
WBC: 5.3 10*3/uL (ref 3.6–11.0)

## 2016-12-05 LAB — TSH: TSH: 0.777 u[IU]/mL (ref 0.350–4.500)

## 2016-12-05 LAB — IRON: IRON: 116 ug/dL (ref 28–170)

## 2016-12-05 LAB — VITAMIN B12: VITAMIN B 12: 1338 pg/mL — AB (ref 180–914)

## 2016-12-05 LAB — FERRITIN: FERRITIN: 16 ng/mL (ref 11–307)

## 2016-12-05 LAB — FOLATE: FOLATE: 41 ng/mL (ref >5.9)

## 2016-12-05 LAB — MAGNESIUM: MAGNESIUM: 2.1 mg/dL (ref 1.7–2.4)

## 2016-12-09 LAB — PTH, INTACT AND CALCIUM
CALCIUM TOTAL (PTH): 9.5 mg/dL (ref 8.7–10.2)
PTH: 23 pg/mL (ref 15–65)

## 2016-12-09 LAB — VITAMIN E
VITAMIN E(GAMMA TOCOPHEROL): 0.7 mg/L (ref 0.5–5.5)
Vitamin E (Alpha Tocopherol): 13.1 mg/L (ref 7.0–25.1)

## 2016-12-09 LAB — VITAMIN D 25 HYDROXY (VIT D DEFICIENCY, FRACTURES): Vit D, 25-Hydroxy: 43.7 ng/mL (ref 30.0–100.0)

## 2016-12-09 LAB — ZINC: Zinc: 92 ug/dL (ref 56–134)

## 2016-12-09 LAB — HEMOGLOBIN A1C
Hgb A1c MFr Bld: 5.5 % (ref 4.8–5.6)
Mean Plasma Glucose: 111 mg/dL

## 2016-12-09 LAB — VITAMIN A: Vitamin A (Retinoic Acid): 46.5 ug/dL (ref 33.1–100.0)

## 2016-12-09 LAB — VITAMIN K1, SERUM: VITAMIN K1: 0.31 ng/mL (ref 0.13–1.88)

## 2016-12-09 LAB — COPPER, SERUM: COPPER: 102 ug/dL (ref 72–166)

## 2016-12-09 LAB — VITAMIN B1: VITAMIN B1 (THIAMINE): 106.3 nmol/L (ref 66.5–200.0)

## 2016-12-15 ENCOUNTER — Ambulatory Visit: Payer: Self-pay | Admitting: Physician Assistant

## 2016-12-15 ENCOUNTER — Ambulatory Visit: Payer: Self-pay | Admitting: Family

## 2016-12-15 VITALS — BP 100/70 | HR 74 | Temp 98.0°F

## 2016-12-15 DIAGNOSIS — J019 Acute sinusitis, unspecified: Secondary | ICD-10-CM

## 2016-12-15 DIAGNOSIS — J301 Allergic rhinitis due to pollen: Secondary | ICD-10-CM

## 2016-12-15 MED ORDER — FLUTICASONE PROPIONATE 50 MCG/ACT NA SUSP: 2.0000 | Freq: Every day | NASAL | 6 refills | Status: DC

## 2016-12-15 MED ORDER — AZITHROMYCIN 250 MG PO TABS: ORAL_TABLET | ORAL | 0 refills | Status: DC

## 2016-12-15 NOTE — Progress Notes (Signed)
S seasonal allergies now with acute sxs O/ VSs mildly ill appearing , ENT nasal mucosa red with purulent rhinorhea, tender , throat clear, neck supple,heart rsr lungs clear A/ allergic and acute rhinosinusitis P/zpack, flonase Supportive measures discussed. Follow up prn not improving    Nasal saline products bid and prn

## 2016-12-25 ENCOUNTER — Ambulatory Visit: Payer: Self-pay | Admitting: Physician Assistant

## 2016-12-25 ENCOUNTER — Encounter: Payer: Self-pay | Admitting: Physician Assistant

## 2016-12-25 VITALS — BP 98/70 | HR 88 | Temp 98.0°F

## 2016-12-25 DIAGNOSIS — B029 Zoster without complications: Secondary | ICD-10-CM

## 2016-12-25 MED ORDER — TOBRAMYCIN 0.3 % OP SOLN: 2.0000 [drp] | OPHTHALMIC | 0 refills | Status: DC

## 2016-12-25 MED ORDER — FAMCICLOVIR 500 MG PO TABS: 500.0000 mg | ORAL_TABLET | Freq: Three times a day (TID) | ORAL | 0 refills | Status: DC

## 2016-12-25 NOTE — Progress Notes (Signed)
S: c/o bumps on upper lid, tingling at side of head and face, swollen node on area in front of ear, no fever/chills, no eye pain, some crusting in both eyes in the morning, is really stressed right now, has a family member at hospice that is dying  O: vitals wnl, nad, skin on upper lid with few dried bumps, ?vesicles, swelling of upper lid, no drainage or matting, conjunctiva wnl, +preauricular node on r, neck supple no lymph, lungs c t a, cv rrr  A: shingles  P: famvir, tobramycin opth gtts, f/u wit opth today

## 2016-12-25 NOTE — Progress Notes (Signed)
Contacted [facility] spoke with [facility] appointment scheduled today @ 2:45. Patient has been notified.

## 2017-01-01 ENCOUNTER — Encounter: Payer: Self-pay | Admitting: Physician Assistant

## 2017-01-01 ENCOUNTER — Ambulatory Visit: Payer: Self-pay | Admitting: Physician Assistant

## 2017-01-01 VITALS — BP 100/72 | HR 67 | Temp 97.7°F

## 2017-01-01 DIAGNOSIS — F411 Generalized anxiety disorder: Secondary | ICD-10-CM

## 2017-01-01 DIAGNOSIS — B029 Zoster without complications: Secondary | ICD-10-CM

## 2017-01-01 MED ORDER — METHYLPREDNISOLONE 4 MG PO TBPK: ORAL_TABLET | ORAL | 0 refills | Status: DC

## 2017-01-01 MED ORDER — SERTRALINE HCL 50 MG PO TABS: 50.0000 mg | ORAL_TABLET | Freq: Every day | ORAL | 3 refills | Status: DC

## 2017-01-01 NOTE — Progress Notes (Signed)
S: states the shingles are drying up, has a recheck with opth to make sure her eye is ok, is having itching and tingling along the scalp, no drainage from sites, the ones on her lid have finally crusted over, denies fever/chills, also still having a lot of stress and ?anxiety over several situations, no si/hi  O: vitals wnl, nad, skin on lid has crusted lesions, scalp is free of lesions, neck supple no lymph, lungs c t a, cv rrr  A: shingles, anxiety  P medrol dose pack, zoloft 50mg  , referred to [facility]

## 2017-01-02 DIAGNOSIS — B0239 Other herpes zoster eye disease: Secondary | ICD-10-CM | POA: Diagnosis not present

## 2017-03-05 ENCOUNTER — Ambulatory Visit: Payer: Self-pay | Admitting: Family Medicine

## 2017-05-29 ENCOUNTER — Encounter: Payer: Self-pay | Admitting: Family Medicine

## 2017-05-29 ENCOUNTER — Ambulatory Visit (INDEPENDENT_AMBULATORY_CARE_PROVIDER_SITE_OTHER): Payer: 59 | Admitting: Certified Nurse Midwife

## 2017-05-29 ENCOUNTER — Ambulatory Visit: Payer: Self-pay | Admitting: Family Medicine

## 2017-05-29 ENCOUNTER — Ambulatory Visit (INDEPENDENT_AMBULATORY_CARE_PROVIDER_SITE_OTHER): Payer: 59 | Admitting: Family Medicine

## 2017-05-29 ENCOUNTER — Encounter: Payer: Self-pay | Admitting: Certified Nurse Midwife

## 2017-05-29 VITALS — BP 118/76 | HR 64 | Ht 65.0 in | Wt 151.0 lb

## 2017-05-29 VITALS — BP 102/70 | HR 72 | Temp 98.2°F | Wt 153.8 lb

## 2017-05-29 DIAGNOSIS — F411 Generalized anxiety disorder: Secondary | ICD-10-CM | POA: Diagnosis not present

## 2017-05-29 DIAGNOSIS — Z124 Encounter for screening for malignant neoplasm of cervix: Secondary | ICD-10-CM | POA: Diagnosis not present

## 2017-05-29 DIAGNOSIS — Z01419 Encounter for gynecological examination (general) (routine) without abnormal findings: Secondary | ICD-10-CM | POA: Diagnosis not present

## 2017-05-29 DIAGNOSIS — Z1239 Encounter for other screening for malignant neoplasm of breast: Secondary | ICD-10-CM

## 2017-05-29 DIAGNOSIS — Z9884 Bariatric surgery status: Secondary | ICD-10-CM

## 2017-05-29 DIAGNOSIS — Z1231 Encounter for screening mammogram for malignant neoplasm of breast: Secondary | ICD-10-CM

## 2017-05-29 DIAGNOSIS — Z1211 Encounter for screening for malignant neoplasm of colon: Secondary | ICD-10-CM

## 2017-05-29 NOTE — Patient Instructions (Signed)
Nice to meet you. We'll get you set up for a mammogram.

## 2017-05-29 NOTE — Assessment & Plan Note (Signed)
Well-controlled on Zoloft. She'll continue this medication.

## 2017-05-29 NOTE — Progress Notes (Signed)
  Tommi Rumps, MD Phone: [phone]  Michele Rivera is a 46 y.o. female who presents today for follow-up.  Anxiety: Patient takes Zoloft 25 mg daily. She came off of this for a period of time though she had a good friend pass away from cancer and has been dealing with the fast pace of life with children and this was starting to make her more anxious. She notes the Zoloft helped significantly. No depression.  She has had a gastric bypass in 2016. She lost 110 pounds. She is maintaining her weight well. Her sleep apnea and restless leg syndrome resolved after the surgery. Still follows with the surgeon. Lab work earlier this year.  ROS see history of present illness  Objective  Physical Exam Vitals:   05/29/17 0805  BP: 102/70  Pulse: 72  Temp: 98.2 F (36.8 C)  SpO2: 96%    BP Readings from Last 3 Encounters:  05/29/17 102/70  01/01/17 100/72  12/25/16 98/70   Wt Readings from Last 3 Encounters:  05/29/17 153 lb 12.8 oz (69.8 kg)  03/14/16 145 lb 12.8 oz (66.1 kg)  09/14/15 155 lb 4 oz (70.4 kg)    Physical Exam  Constitutional: No distress.  Cardiovascular: Normal rate, regular rhythm and normal heart sounds.   Pulmonary/Chest: Effort normal and breath sounds normal.  Musculoskeletal: She exhibits no edema.  Neurological: She is alert. Gait normal.  Skin: Skin is warm and dry. She is not diaphoretic.     Assessment/Plan: Please see individual problem list.  Anxiety state Well-controlled on Zoloft. She'll continue this medication.  Status post gastric bypass for obesity Patient is doing remarkably well. She is maintaining weight loss. Continue to follow with the surgeon. Lab work earlier this year.   Orders Placed This Encounter  Procedures  . MM SCREENING BREAST TOMO BILATERAL    Standing Status:   Future    Standing Expiration Date:   07/29/2018    Order Specific Question:   Reason for Exam (SYMPTOM  OR DIAGNOSIS REQUIRED)    Answer:   breast  cancer screening    Order Specific Question:   Is the patient pregnant?    Answer:   No    Order Specific Question:   Preferred imaging location?    Answer:   [facility]    No orders of the defined types were placed in this encounter.   # Healthcare maintenance: mammogram ordered. Patient will contact [place] to set up.   Tommi Rumps, MD [place]

## 2017-05-29 NOTE — Assessment & Plan Note (Signed)
Patient is doing remarkably well. She is maintaining weight loss. Continue to follow with the surgeon. Lab work earlier this year.

## 2017-05-29 NOTE — Progress Notes (Signed)
Gynecology Annual Exam  PCP: Leone Haven, MD  Chief Complaint:  Chief Complaint  Patient presents with  . Gynecologic Exam    History of Present Illness:Michele Rivera presents today for her annual gynecological exam. She is a 47 year old Caucasian/White female, [place], whose LMP was 2013. Her menses are absent after having a LSH and BSO in 2013 for fibroids/AUB. She does have hot flashes and night sweats. She has not had any spotting.  The patient's past medical history is remarkable for having gastric bypass surgery in 2016. Since then she has lost 110#.   Since her last annual GYN exam dated 09/10/2015 , she has been under increased stress and was experiencing more anxiety. She was treated with Zoloft 25 mgm by her PCP with relief of her symptoms. She is sexually active. She denies any vag dryness. She denies any history of STDs.  Her most recent pap smear was obtained 05/18/2014 and was negative cells with negative HPV DNA.  Her most recent mammogram obtained 05/05/2016 was normal. There is no family history of breast cancer. There is no family history of ovarian cancer. The patient sometimes does do monthly self breast exams.  The patient does not smoke.  The patient  rarely drinks alcohol.  The patient does not use illegal drugs.  The patient exercises regularly on the elliptical or treadmill 3x/week. The patient does get adequate calcium in her diet and with her Bariatric vitamins. She has her lipid panel and hemoglobin A1C done in April 2018 and the results were normal.  The patient denies current symptoms of depression.    Review of Systems: Review of Systems  Constitutional: Negative for chills, fever and weight loss.  HENT: Negative for congestion, sinus pain and sore throat.   Eyes: Negative for blurred vision and pain.  Respiratory: Negative for hemoptysis, shortness of breath and wheezing.   Cardiovascular: Negative for chest pain, palpitations and leg  swelling.  Gastrointestinal: Negative for abdominal pain, blood in stool, diarrhea, heartburn, nausea and vomiting.  Genitourinary: Negative for dysuria, frequency, hematuria and urgency.  Musculoskeletal: Negative for back pain, joint pain and myalgias.  Skin: Negative for itching and rash.  Neurological: Negative for dizziness, tingling and headaches.  Endo/Heme/Allergies: Negative for environmental allergies and polydipsia. Does not bruise/bleed easily.       Negative for hirsutism   Psychiatric/Behavioral: Negative for depression. The patient is not nervous/anxious and does not have insomnia.     Past Medical History:  Past Medical History:  Diagnosis Date  . Anxiety   . GERD (gastroesophageal reflux disease)   . Headache   . History of mammogram 12/28/2012   BRIAD 2 @ [facility]  . Leiomyoma of uterus 2010  . Menorrhagia   . Plantar fasciitis   . PONV (postoperative nausea and vomiting)    following spinal for c section  . Restless legs   . Sleep apnea     Past Surgical History:  Past Surgical History:  Procedure Laterality Date  . CESAREAN SECTION  1999/ 2002  . GASTRIC ROUX-EN-Y N/A 02/13/2015   Procedure: LAPAROSCOPIC ROUX-EN-Y GASTRIC BYPASS WITH UPPER ENDOSCOPY;  Surgeon: Ladora Daniel, MD;  Location: [facility] ORS;  Service: General;  Laterality: N/A;  . LAPAROSCOPIC BILATERAL SALPINGO OOPHERECTOMY  2013  . LAPAROSCOPIC SUPRACERVICAL HYSTERECTOMY  2013   Dr. Laurey Morale for menorrhagia/ fibroids and BSO    Family History:  Family History  Problem Relation Age of Onset  . Hypertension Mother   .  Vision loss Mother   . Alcohol abuse Father   . Kidney disease Son   . Heart attack Maternal Grandmother        also maternal great aunts and uncles  . Melanoma Maternal Uncle     Social History:  Social History   Social History  . Marital status: Married    Spouse name: N/A  . Number of children: 2  . Years of education: N/A   Occupational History  . Water quality scientist    Social History Main Topics  . Smoking status: Never Smoker  . Smokeless tobacco: Never Used  . Alcohol use Yes     Comment: rare  . Drug use: No  . Sexual activity: Yes    Partners: Male    Birth control/ protection: Surgical   Other Topics Concern  . Not on file   Social History Narrative   Lives in [place] with husband and 2 children.    Allergies:  Allergies  Allergen Reactions  . Paroxetine     drowsiness  . Phentermine     Chills, chest heaviness  . Sulfa Antibiotics Rash    Medications: Prior to Admission medications   Medication Sig Start Date End Date Taking? Authorizing Provider  fluticasone (FLONASE) 50 MCG/ACT nasal spray Place 2 sprays into both nostrils daily. 12/15/16   Blima Singer, NP  sertraline (ZOLOFT) 50 MG tablet Take 1 tablet (50 mg total) by mouth daily. Patient taking differently: Take 25 mg by mouth daily.  01/01/17   Versie Starks, PA-C  And Bariatric vitamins and calcium supplements  Physical Exam Vitals: BP 118/76   Pulse 64   Ht '5\' 5"'  (1.651 m)   Wt 151 lb (68.5 kg)   BMI 25.13 kg/m   General: WF in NAD HEENT: normocephalic, anicteric Neck: no thyroid enlargement, no palpable nodules, no cervical lymphadenopathy  Pulmonary: No increased work of breathing, CTAB Cardiovascular: RRR, without murmur  Breast: Breast symmetrical, no tenderness, no palpable nodules or masses, no skin or nipple retraction present, no nipple discharge.  No axillary, infraclavicular or supraclavicular lymphadenopathy. Abdomen: Soft, non-tender, non-distended.  Umbilicus without lesions.  No hepatomegaly or masses palpable. No evidence of hernia. Genitourinary:  External: Normal external female genitalia.  Normal urethral meatus, normal Bartholin's and Skene's glands.    Vagina: Normal vaginal mucosa, no evidence of prolapse, tender   Cervix: Grossly normal in appearance, pale, friable at 11 o'clock when bumped with speculum,  non-tender  Uterus: surgically absent  Adnexa: no masses, NT  Rectal: deferred  Lymphatic: no evidence of inguinal lymphadenopathy Extremities: no edema, erythema, or tenderness Neurologic: Grossly intact Psychiatric: mood appropriate, affect full     Assessment: 46 y.o. annual gyn exam  Plan:   1) Breast cancer screening - recommend monthly self breast exam and annual screening mammogram. PCP ordered mmamogram. Patient to schedule appt at [facility]  2) Colon cancer screening: discussed recent ACS recommendations for starting colon cancer screening at age 83. Discussed options. Will do FIT test on stool. Home collection kit given  3) Cervical cancer screening - Pap was done. ASCCP guidelines and rational discussed.  Patient opts for every 3 years screening interval  4) Osteoporosis prevention: Discussed calcium and vitamin D3 requirements. Encouraged some strength training also  5) Routine healthcare maintenance including cholesterol and diabetes screening UTD   6) RTO in 1 year and prn  Dalia Heading, CNM

## 2017-05-31 LAB — IGP, APTIMA HPV
HPV Aptima: NEGATIVE
PAP Smear Comment: 0

## 2017-06-05 ENCOUNTER — Ambulatory Visit: Payer: Self-pay | Admitting: Certified Nurse Midwife

## 2017-06-19 ENCOUNTER — Encounter: Payer: Self-pay | Admitting: Family Medicine

## 2017-08-05 ENCOUNTER — Encounter: Payer: Self-pay | Admitting: Physician Assistant

## 2017-08-05 ENCOUNTER — Ambulatory Visit: Payer: Self-pay | Admitting: Physician Assistant

## 2017-08-05 VITALS — BP 110/60 | HR 69 | Temp 98.5°F | Resp 16

## 2017-08-05 DIAGNOSIS — J209 Acute bronchitis, unspecified: Secondary | ICD-10-CM

## 2017-08-05 MED ORDER — PROMETHAZINE-DM 6.25-15 MG/5ML PO SYRP: 5.0000 mL | ORAL_SOLUTION | Freq: Four times a day (QID) | ORAL | 0 refills | Status: DC | PRN

## 2017-08-05 MED ORDER — AZITHROMYCIN 250 MG PO TABS: ORAL_TABLET | ORAL | 0 refills | Status: DC

## 2017-08-05 NOTE — Progress Notes (Signed)
   Subjective: Cough/chest congestion     Patient ID: Michele Rivera, female    DOB: [birth year], 46 y.o.   MRN: [id number]  HPI Patient complaining approximately 8 days of nasal and chest congestion. Patient stated productive cough which is thick and full greenish yellowish tinge. Patient state cough increase with deep respirations. Patient stated no relief over-the-counter medications.   Review of Systems Gastric bypass    Objective:   Physical Exam HEENT is remarkable for bilateral maxillary guarding edematous nasal turbinates. Pharynx showed copious postnasal drainage tracks. Neck is supple without adenopathy. Lungs have bilateral Rales. Heart is regular rate and rhythm.       Assessment & Plan: Bronchitis   Patient given discharge care instructions. Patient given a prescription for Zithromax and Phenergan DM. Patient advised follow-up PCP if no improvement in one week.

## 2017-08-24 ENCOUNTER — Ambulatory Visit
Admission: RE | Admit: 2017-08-24 | Discharge: 2017-08-24 | Disposition: A | Discharge status: Home / Self Care | Payer: 59 | Source: Ambulatory Visit | Attending: Family Medicine | Admitting: Family Medicine

## 2017-08-24 DIAGNOSIS — Z1231 Encounter for screening mammogram for malignant neoplasm of breast: Secondary | ICD-10-CM | POA: Insufficient documentation

## 2017-08-24 DIAGNOSIS — Z1239 Encounter for other screening for malignant neoplasm of breast: Secondary | ICD-10-CM

## 2017-10-22 DIAGNOSIS — Z9884 Bariatric surgery status: Secondary | ICD-10-CM | POA: Diagnosis not present

## 2017-10-22 DIAGNOSIS — R635 Abnormal weight gain: Secondary | ICD-10-CM | POA: Diagnosis not present

## 2017-10-26 ENCOUNTER — Encounter: Payer: Self-pay | Admitting: Family Medicine

## 2017-11-06 DIAGNOSIS — Z9884 Bariatric surgery status: Secondary | ICD-10-CM | POA: Diagnosis not present

## 2017-11-06 DIAGNOSIS — Z713 Dietary counseling and surveillance: Secondary | ICD-10-CM | POA: Diagnosis not present

## 2017-12-01 ENCOUNTER — Ambulatory Visit: Payer: Self-pay | Admitting: Family Medicine

## 2017-12-04 ENCOUNTER — Encounter: Payer: Self-pay | Admitting: Family Medicine

## 2017-12-04 ENCOUNTER — Ambulatory Visit (INDEPENDENT_AMBULATORY_CARE_PROVIDER_SITE_OTHER): Payer: 59 | Admitting: Family Medicine

## 2017-12-04 VITALS — BP 108/76 | HR 68 | Temp 98.0°F | Resp 16 | Ht 65.0 in | Wt 156.0 lb

## 2017-12-04 DIAGNOSIS — Z0000 Encounter for general adult medical examination without abnormal findings: Secondary | ICD-10-CM | POA: Diagnosis not present

## 2017-12-04 DIAGNOSIS — F411 Generalized anxiety disorder: Secondary | ICD-10-CM | POA: Diagnosis not present

## 2017-12-04 DIAGNOSIS — Z9884 Bariatric surgery status: Secondary | ICD-10-CM

## 2017-12-04 DIAGNOSIS — Z114 Encounter for screening for human immunodeficiency virus [HIV]: Secondary | ICD-10-CM | POA: Diagnosis not present

## 2017-12-04 MED ORDER — SERTRALINE HCL 50 MG PO TABS: 25.0000 mg | ORAL_TABLET | Freq: Every day | ORAL | 3 refills | Status: DC

## 2017-12-04 NOTE — Patient Instructions (Signed)
Preventive Care 40-64 Years, Female Preventive care refers to lifestyle choices and visits with your health care provider that can promote health and wellness. What does preventive care include?  A yearly physical exam. This is also called an annual well check.  Dental exams once or twice a year.  Routine eye exams. Ask your health care provider how often you should have your eyes checked.  Personal lifestyle choices, including: ? Daily care of your teeth and gums. ? Regular physical activity. ? Eating a healthy diet. ? Avoiding tobacco and drug use. ? Limiting alcohol use. ? Practicing safe sex. ? Taking low-dose aspirin daily starting at age 58. ? Taking vitamin and mineral supplements as recommended by your health care provider. What happens during an annual well check? The services and screenings done by your health care provider during your annual well check will depend on your age, overall health, lifestyle risk factors, and family history of disease. Counseling Your health care provider may ask you questions about your:  Alcohol use.  Tobacco use.  Drug use.  Emotional well-being.  Home and relationship well-being.  Sexual activity.  Eating habits.  Work and work Statistician.  Method of birth control.  Menstrual cycle.  Pregnancy history.  Screening You may have the following tests or measurements:  Height, weight, and BMI.  Blood pressure.  Lipid and cholesterol levels. These may be checked every 5 years, or more frequently if you are over 81 years old.  Skin check.  Lung cancer screening. You may have this screening every year starting at age 78 if you have a 30-pack-year history of smoking and currently smoke or have quit within the past 15 years.  Fecal occult blood test (FOBT) of the stool. You may have this test every year starting at age 65.  Flexible sigmoidoscopy or colonoscopy. You may have a sigmoidoscopy every 5 years or a colonoscopy  every 10 years starting at age 30.  Hepatitis C blood test.  Hepatitis B blood test.  Sexually transmitted disease (STD) testing.  Diabetes screening. This is done by checking your blood sugar (glucose) after you have not eaten for a while (fasting). You may have this done every 1-3 years.  Mammogram. This may be done every 1-2 years. Talk to your health care provider about when you should start having regular mammograms. This may depend on whether you have a family history of breast cancer.  BRCA-related cancer screening. This may be done if you have a family history of breast, ovarian, tubal, or peritoneal cancers.  Pelvic exam and Pap test. This may be done every 3 years starting at age 80. Starting at age 36, this may be done every 5 years if you have a Pap test in combination with an HPV test.  Bone density scan. This is done to screen for osteoporosis. You may have this scan if you are at high risk for osteoporosis.  Discuss your test results, treatment options, and if necessary, the need for more tests with your health care provider. Vaccines Your health care provider may recommend certain vaccines, such as:  Influenza vaccine. This is recommended every year.  Tetanus, diphtheria, and acellular pertussis (Tdap, Td) vaccine. You may need a Td booster every 10 years.  Varicella vaccine. You may need this if you have not been vaccinated.  Zoster vaccine. You may need this after age 5.  Measles, mumps, and rubella (MMR) vaccine. You may need at least one dose of MMR if you were born in  1957 or later. You may also need a second dose.  Pneumococcal 13-valent conjugate (PCV13) vaccine. You may need this if you have certain conditions and were not previously vaccinated.  Pneumococcal polysaccharide (PPSV23) vaccine. You may need one or two doses if you smoke cigarettes or if you have certain conditions.  Meningococcal vaccine. You may need this if you have certain  conditions.  Hepatitis A vaccine. You may need this if you have certain conditions or if you travel or work in places where you may be exposed to hepatitis A.  Hepatitis B vaccine. You may need this if you have certain conditions or if you travel or work in places where you may be exposed to hepatitis B.  Haemophilus influenzae type b (Hib) vaccine. You may need this if you have certain conditions.  Talk to your health care provider about which screenings and vaccines you need and how often you need them. This information is not intended to replace advice given to you by your health care provider. Make sure you discuss any questions you have with your health care provider. Document Released: 08/31/2015 Document Revised: 04/23/2016 Document Reviewed: 06/05/2015 Elsevier Interactive Patient Education  2018 Elsevier Inc.  

## 2017-12-04 NOTE — Progress Notes (Signed)
Patient: Michele Rivera, Female    DOB: Oct 04, 1970, 47 y.o.   MRN: [id number] Visit Date: 12/04/2017  Today's Provider: Lavon Paganini, MD   I, Martha Clan, CMA, am acting as scribe for Lavon Paganini, MD.  Chief Complaint  Patient presents with  . Establish Care   Subjective:    Establish Care Michele Rivera is a 47 y.o. female who presents today to establish care. She feels fairly well. She reports exercising about 3 days per week for 20 minutes. Walks or uses elliptical during lunch. She reports she is sleeping fairly well. She states she stays awake at night "thinking too much". She is taking Sertraline 25 mg for anxiety.  S/p roux-en-y gastric bypass about 3 yrs ago.  Diplomatic Services operational officer annually at [employer]  Taking vitamins consistently.  Following diet well.  Did gain 8lbs back.  Wonders if this is related to Zoloft.  She has been taking 25 mg daily of Zoloft for about the past year.  She is lost 110 pounds total from her gastric bypass.  She was recommended to maybe try Wellbutrin instead of Zoloft by her surgeon.  Her GERD and OSA are significantly better since bariatric surgery and she does not require any treatments for these.  H/o Shingles in May 2018 near eye (seen by Ashok Cordia at [facility]).  Last Pap smear normal and HPV negative in 2018 Last mammogram in 08/2017 Status post supracervical hysterectomy for fibroids -----------------------------------------------------------------   Review of Systems  Constitutional: Negative.   HENT: Negative.   Eyes: Negative.   Respiratory: Negative.   Cardiovascular: Negative.   Gastrointestinal: Negative.   Endocrine: Negative.   Genitourinary: Negative.   Musculoskeletal: Negative.   Skin: Negative.   Allergic/Immunologic: Negative.   Neurological: Negative.   Hematological: Negative.   Psychiatric/Behavioral: Negative for agitation, behavioral problems, confusion, decreased concentration,  dysphoric mood, hallucinations, self-injury, sleep disturbance and suicidal ideas. The patient is nervous/anxious. The patient is not hyperactive.     Social History      She  reports that she has never smoked. She has never used smokeless tobacco. She reports that she drinks alcohol. She reports that she does not use drugs.       Social History   Socioeconomic History  . Marital status: Married    Spouse name: Not on file  . Number of children: 2  . Years of education: Not on file  . Highest education level: Not on file  Occupational History  . Occupation: Editor, commissioning  Social Needs  . Financial resource strain: Not on file  . Food insecurity:    Worry: Not on file    Inability: Not on file  . Transportation needs:    Medical: Not on file    Non-medical: Not on file  Tobacco Use  . Smoking status: Never Smoker  . Smokeless tobacco: Never Used  Substance and Sexual Activity  . Alcohol use: Yes    Comment: rare  . Drug use: No  . Sexual activity: Yes    Partners: Male    Birth control/protection: Surgical  Lifestyle  . Physical activity:    Days per week: Not on file    Minutes per session: Not on file  . Stress: Not on file  Relationships  . Social connections:    Talks on phone: Not on file    Gets together: Not on file    Attends religious service: Not on file    Active  member of club or organization: Not on file    Attends meetings of clubs or organizations: Not on file    Relationship status: Not on file  Other Topics Concern  . Not on file  Social History Narrative   Lives in [place] with husband and 2 children.    Past Medical History:  Diagnosis Date  . Anxiety   . GERD (gastroesophageal reflux disease)   . Headache   . History of mammogram 12/28/2012   BRIAD 2 @ [place]  . Leiomyoma of uterus 2010  . Menorrhagia   . Plantar fasciitis   . PONV (postoperative nausea and vomiting)    following spinal for c section  . Restless legs     . Sleep apnea      Patient Active Problem List   Diagnosis Date Noted  . Status post gastric bypass for obesity 05/29/2017  . Routine general medical examination at a health care facility 02/15/2014  . Allergic rhinitis 11/14/2013  . Screening for breast cancer 11/14/2013  . Anxiety state 04/04/2013    Past Surgical History:  Procedure Laterality Date  . CESAREAN SECTION  1999/ 2002  . GASTRIC ROUX-EN-Y N/A 02/13/2015   Procedure: LAPAROSCOPIC ROUX-EN-Y GASTRIC BYPASS WITH UPPER ENDOSCOPY;  Surgeon: Ladora Daniel, MD;  Location: [facility] ORS;  Service: General;  Laterality: N/A;  . LAPAROSCOPIC BILATERAL SALPINGO OOPHERECTOMY  2013  . LAPAROSCOPIC SUPRACERVICAL HYSTERECTOMY  2013   Dr. Laurey Morale for menorrhagia/ fibroids and BSO    Family History        Family Status  Relation Name Status  . Mother  58, age 31y  . Father  Deceased at age 63  . Son  Alive  . Sister  Alive, age 90y  . Brother  Alive, age 44y  . Son  Alive  . MGM  Deceased  . Mat Uncle  (Not Specified)  . Neg Hx  (Not Specified)        Her family history includes Alcohol abuse in her father; Heart attack in her maternal grandmother; Hypertension in her mother; Kidney disease in her son; Melanoma in her maternal uncle; Vision loss in her mother. There is no history of Breast cancer.      Allergies  Allergen Reactions  . Paroxetine     drowsiness  . Phentermine     Chills, chest heaviness  . Sulfa Antibiotics Rash     Current Outpatient Medications:  .  Calcium Carbonate-Vit D-Min (CALCIUM 1200) 1200-1000 MG-UNIT CHEW, Chew 1 Piece by mouth daily. Bariatric Chew, Disp: , Rfl:  .  Multiple Vitamins-Minerals (BARIATRIC FUSION) CHEW, Chew 2 tablets by mouth daily., Disp: , Rfl:  .  sertraline (ZOLOFT) 50 MG tablet, Take 1 tablet (50 mg total) by mouth daily. (Patient taking differently: Take 25 mg by mouth daily. ), Disp: 30 tablet, Rfl: 3   Patient Care Team: Virginia Crews, MD as PCP - General  (Family Medicine)      Objective:   Vitals: BP 108/76 (BP Location: Left Arm, Patient Position: Sitting, Cuff Size: Normal)   Pulse 68   Temp 98 F (36.7 C) (Oral)   Resp 16   Ht 5\' 5"  (1.651 m)   Wt 156 lb (70.8 kg)   LMP  (Exact Date)   BMI 25.96 kg/m    Vitals:   12/04/17 1503  BP: 108/76  Pulse: 68  Resp: 16  Temp: 98 F (36.7 C)  TempSrc: Oral  Weight: 156 lb (70.8 kg)  Height: 5'  5" (1.651 m)     Physical Exam  Constitutional: She is oriented to person, place, and time. She appears well-developed and well-nourished. No distress.  HENT:  Head: Normocephalic and atraumatic.  Right Ear: External ear normal.  Left Ear: External ear normal.  Nose: Nose normal.  Mouth/Throat: Oropharynx is clear and moist.  Eyes: Pupils are equal, round, and reactive to light. Conjunctivae and EOM are normal. No scleral icterus.  Neck: Neck supple. No thyromegaly present.  Cardiovascular: Normal rate, regular rhythm, normal heart sounds and intact distal pulses.  No murmur heard. Pulmonary/Chest: Breath sounds normal. No respiratory distress. She has no wheezes. She has no rales.  Abdominal: Soft. Bowel sounds are normal. She exhibits no distension. There is no tenderness. There is no rebound and no guarding.  Musculoskeletal: She exhibits no edema or deformity.  Lymphadenopathy:    She has no cervical adenopathy.  Neurological: She is alert and oriented to person, place, and time.  Skin: Skin is warm and dry. No rash noted.  Psychiatric: She has a normal mood and affect. Her behavior is normal.  Vitals reviewed.    Depression Screen PHQ 2/9 Scores 04/04/2013  PHQ - 2 Score 0     Assessment & Plan:     Routine Health Maintenance and Physical Exam  Exercise Activities and Dietary recommendations Goals    None      Immunization History  Administered Date(s) Administered  . Influenza-Unspecified 05/27/2013, 05/01/2014, 06/12/2015, 05/21/2016  . Tdap 11/16/2009     Health Maintenance  Topic Date Due  . HIV Screening  02/28/1985  . INFLUENZA VACCINE  03/18/2018  . MAMMOGRAM  08/24/2018  . PAP SMEAR  05/29/2020  . TETANUS/TDAP  12/31/2022     Discussed health benefits of physical activity, and encouraged her to engage in regular exercise appropriate for her age and condition.    --------------------------------------------------------------------  Problem List Items Addressed This Visit      Other   Anxiety state    Very well controlled on low-dose Zoloft Discussed other medication options Wellbutrin can increase anxiety and is better for depression This is likely not a great option for her We discussed option of a different SSRI versus BuSpar Unlikely that this very low dose caused her weight gain We will continue this dose and consider switching in the future if she has more difficulty with weight loss Follow-up as needed      Relevant Medications   sertraline (ZOLOFT) 50 MG tablet   Status post gastric bypass for obesity    Patient is doing remarkably well and she is maintaining her weight loss Continue to follow with her surgeon annually Lab work pending       Other Visit Diagnoses    Encounter for annual physical exam    -  Primary   Screening for HIV (human immunodeficiency virus)       Relevant Orders   HIV antibody (with reflex)      Return in about 1 year (around 12/05/2018) for CPE.   The entirety of the information documented in the History of Present Illness, Review of Systems and Physical Exam were personally obtained by me. Portions of this information were initially documented by Raquel Sarna Ratchford, CMA and reviewed by me for thoroughness and accuracy.    Virginia Crews, MD, MPH [facility] 12/04/2017 4:14 PM

## 2017-12-04 NOTE — Assessment & Plan Note (Signed)
Very well controlled on low-dose Zoloft Discussed other medication options Wellbutrin can increase anxiety and is better for depression This is likely not a great option for her We discussed option of a different SSRI versus BuSpar Unlikely that this very low dose caused her weight gain We will continue this dose and consider switching in the future if she has more difficulty with weight loss Follow-up as needed

## 2017-12-04 NOTE — Assessment & Plan Note (Signed)
Patient is doing remarkably well and she is maintaining her weight loss Continue to follow with her surgeon annually Lab work pending

## 2017-12-08 ENCOUNTER — Ambulatory Visit: Payer: Self-pay | Admitting: Family Medicine

## 2017-12-16 ENCOUNTER — Ambulatory Visit: Payer: Self-pay | Admitting: Family Medicine

## 2018-01-05 ENCOUNTER — Encounter: Payer: Self-pay | Admitting: Family Medicine

## 2018-01-06 MED ORDER — BUPROPION HCL ER (XL) 150 MG PO TB24: 150.0000 mg | ORAL_TABLET | Freq: Every day | ORAL | 2 refills | Status: DC

## 2018-03-13 ENCOUNTER — Encounter: Payer: Self-pay | Admitting: Family Medicine

## 2018-03-15 ENCOUNTER — Encounter: Payer: Self-pay | Admitting: Family Medicine

## 2018-03-15 ENCOUNTER — Ambulatory Visit (INDEPENDENT_AMBULATORY_CARE_PROVIDER_SITE_OTHER): Payer: 59 | Admitting: Family Medicine

## 2018-03-15 VITALS — BP 112/70 | HR 82 | Resp 16 | Wt 158.0 lb

## 2018-03-15 DIAGNOSIS — R0781 Pleurodynia: Secondary | ICD-10-CM

## 2018-03-15 NOTE — Progress Notes (Signed)
Patient: Michele Rivera Female    DOB: 1971-05-16   47 y.o.   MRN: [id number] Visit Date: 03/15/2018  Today's Provider: Lavon Paganini, MD   I, Martha Clan, CMA, am acting as scribe for Lavon Paganini, MD.  Chief Complaint  Patient presents with  . Fall   Subjective:    Fall  Incident onset: x 1 week. Fall occurred: down stairs. She landed on hard floor. The point of impact was the buttocks. Pain location: right buttock and left rib; pt noticed that it felt as if something "rolled" in her left ribcage the day after the fall. She is concerned of possible rib fx. The pain is mild. Exacerbated by: rib pain exacerbated by deep breathing. Associated symptoms include abdominal pain. Pertinent negatives include no bowel incontinence, fever, headaches, hearing loss, hematuria, loss of consciousness, nausea, numbness, tingling, visual change or vomiting. She has tried acetaminophen and NSAID for the symptoms. The treatment provided mild relief.   Pain with deep breath last 3-4 days.  Better today     Allergies  Allergen Reactions  . Paroxetine     drowsiness  . Phentermine     Chills, chest heaviness  . Sulfa Antibiotics Rash     Current Outpatient Medications:  .  buPROPion (WELLBUTRIN XL) 150 MG 24 hr tablet, Take 1 tablet (150 mg total) by mouth daily., Disp: 30 tablet, Rfl: 2 .  Calcium Carbonate-Vit D-Min (CALCIUM 1200) 1200-1000 MG-UNIT CHEW, Chew 1 Piece by mouth daily. Bariatric Chew, Disp: , Rfl:  .  Cobalamine Combinations (B-12) [phone] MCG SUBL, Place 1 tablet under the tongue daily., Disp: , Rfl:  .  Multiple Vitamins-Minerals (BARIATRIC FUSION) CHEW, Chew 2 tablets by mouth daily., Disp: , Rfl:   Review of Systems  Constitutional: Negative.  Negative for fever.  HENT: Negative.   Respiratory: Positive for shortness of breath. Negative for apnea, cough, choking, chest tightness and wheezing.   Cardiovascular: Negative.   Gastrointestinal: Positive  for abdominal pain. Negative for bowel incontinence, nausea and vomiting.  Genitourinary: Negative for difficulty urinating and hematuria.  Musculoskeletal: Negative.   Neurological: Negative for tingling, loss of consciousness, numbness and headaches.  Psychiatric/Behavioral: Negative.     Social History   Tobacco Use  . Smoking status: Never Smoker  . Smokeless tobacco: Never Used  Substance Use Topics  . Alcohol use: Yes    Comment: one glass of winer per month socially   Objective:   BP 112/70 (BP Location: Left Arm, Patient Position: Sitting, Cuff Size: Normal)   Pulse 82   Resp 16   Wt 158 lb (71.7 kg)   SpO2 99%   BMI 26.29 kg/m  Vitals:   03/15/18 1445  BP: 112/70  Pulse: 82  Resp: 16  SpO2: 99%  Weight: 158 lb (71.7 kg)     Physical Exam  Constitutional: She is oriented to person, place, and time. She appears well-developed and well-nourished. No distress.  HENT:  Head: Normocephalic and atraumatic.  Eyes: Conjunctivae are normal.  Cardiovascular: Normal rate, regular rhythm, normal heart sounds and intact distal pulses.  No murmur heard. Pulmonary/Chest: Effort normal and breath sounds normal. No respiratory distress. She has no wheezes. She has no rales.  Abdominal: Soft. She exhibits no distension. There is no tenderness. There is no guarding.  Musculoskeletal: She exhibits no edema.  TTP over L anterior rib cage  Neurological: She is alert and oriented to person, place, and time.  Skin: Skin is warm  and dry. Capillary refill takes less than 2 seconds.  Psychiatric: She has a normal mood and affect. Her behavior is normal.  Vitals reviewed.      Assessment & Plan:   1. Rib pain on left side -Patient with tenderness to palpation over her left anterior rib cage after a fall -She is unclear whether she actually hit this during her fall or not - Advised that this could be a rib contusion or fracture, but abdominal is benign and reassuring - Offered  x-rays to assess whether there is a fracture, but as this is unlikely to affect her management given that we will treat conservatively anyways patient agrees to wait on this -Discussed symptomatic management with incentive spirometry, ice NSAIDs/Tylenol -Discussed natural course and return precautions   Return if symptoms worsen or fail to improve.   The entirety of the information documented in the History of Present Illness, Review of Systems and Physical Exam were personally obtained by me. Portions of this information were initially documented by Raquel Sarna Ratchford, CMA and reviewed by me for thoroughness and accuracy.    Virginia Crews, MD, MPH [facility] 03/15/2018 4:57 PM

## 2018-03-15 NOTE — Patient Instructions (Signed)
Rib Contusion A rib contusion is a deep bruise on your rib area. Contusions are the result of a blunt trauma that causes bleeding and injury to the tissues under the skin. A rib contusion may involve bruising of the ribs and of the skin and muscles in the area. The skin overlying the contusion may turn blue, purple, or yellow. Minor injuries will give you a painless contusion, but more severe contusions may stay painful and swollen for a few weeks. What are the causes? A contusion is usually caused by a blow, trauma, or direct force to an area of the body. This often occurs while playing contact sports. What are the signs or symptoms?  Swelling and redness of the injured area.  Discoloration of the injured area.  Tenderness and soreness of the injured area.  Pain with or without movement. How is this diagnosed? The diagnosis can be made by taking a medical history and performing a physical exam. An X-ray, CT scan, or MRI may be needed to determine if there were any associated injuries, such as broken bones (fractures) or internal injuries. How is this treated? Often, the best treatment for a rib contusion is rest. Icing or applying cold compresses to the injured area may help reduce swelling and inflammation. Deep breathing exercises may be recommended to reduce the risk of partial lung collapse and pneumonia. Over-the-counter or prescription medicines may also be recommended for pain control. Follow these instructions at home:  Apply ice to the injured area: ? Put ice in a plastic bag. ? Place a towel between your skin and the bag. ? Leave the ice on for 20 minutes, 2-3 times per day.  Take medicines only as directed by your health care provider.  Rest the injured area. Avoid strenuous activity and any activities or movements that cause pain. Be careful during activities and avoid bumping the injured area.  Perform deep-breathing exercises as directed by your health care provider.  Do  not lift anything that is heavier than 5 lb (2.3 kg) until your health care provider approves.  Do not use any tobacco products, including cigarettes, chewing tobacco, or electronic cigarettes. If you need help quitting, ask your health care provider. Contact a health care provider if:  You have increased bruising or swelling.  You have pain that is not controlled with treatment.  You have a fever. Get help right away if:  You have difficulty breathing or shortness of breath.  You develop a continual cough, or you cough up thick or bloody sputum.  You feel sick to your stomach (nauseous), you throw up (vomit), or you have abdominal pain. This information is not intended to replace advice given to you by your health care provider. Make sure you discuss any questions you have with your health care provider. Document Released: 04/29/2001 Document Revised: 01/10/2016 Document Reviewed: 05/16/2014 Elsevier Interactive Patient Education  2018 Elsevier Inc.  

## 2018-03-24 ENCOUNTER — Encounter: Payer: Self-pay | Admitting: Family Medicine

## 2018-03-24 MED ORDER — SERTRALINE HCL 25 MG PO TABS: 25.0000 mg | ORAL_TABLET | Freq: Every day | ORAL | 5 refills | Status: DC

## 2018-04-23 IMAGING — MG MM DIGITAL SCREENING BILAT W/ TOMO W/ CAD
8 of 12 series · 8 of 28 positions shown · non-contrast
Comparison: Previous exam(s).

CLINICAL DATA: Screening.

EXAM:
2D DIGITAL SCREENING BILATERAL MAMMOGRAM WITH 3D TOMO WITH CAD

[R MLO synth-2D]
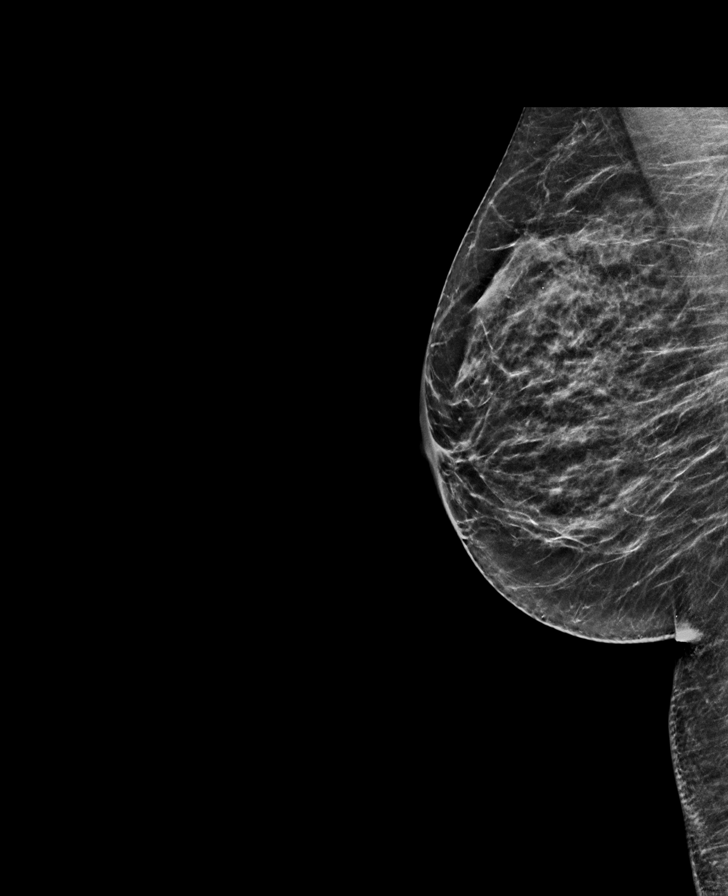

[L CC synth-2D]
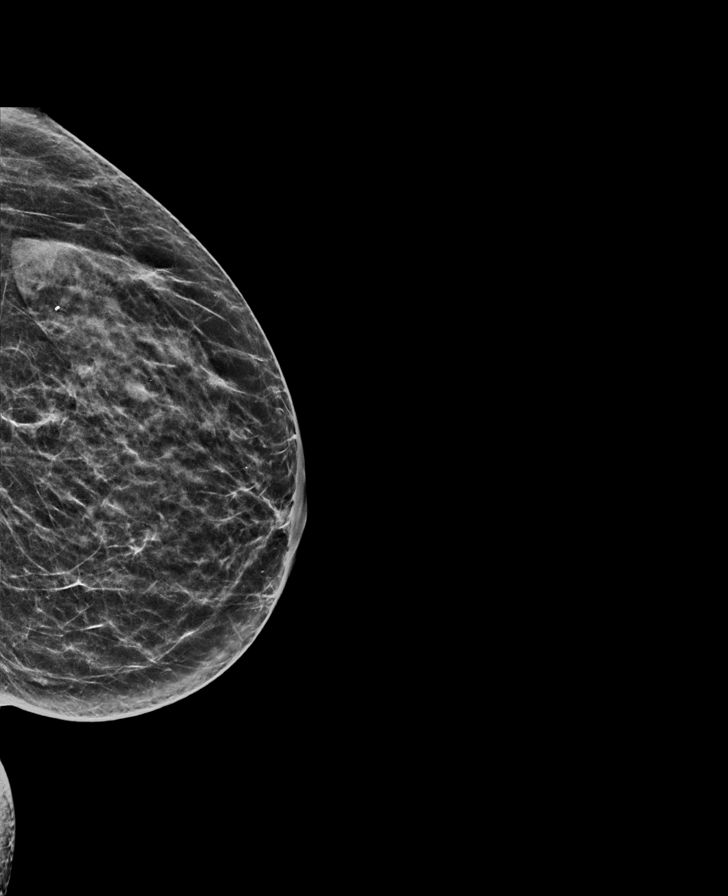

[L MLO]
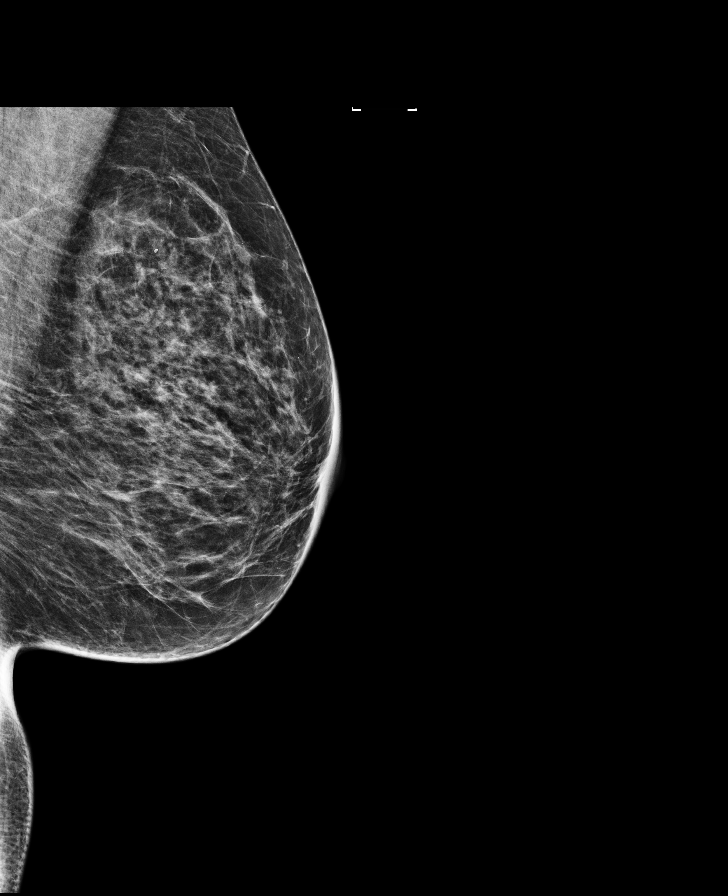

[R MLO]
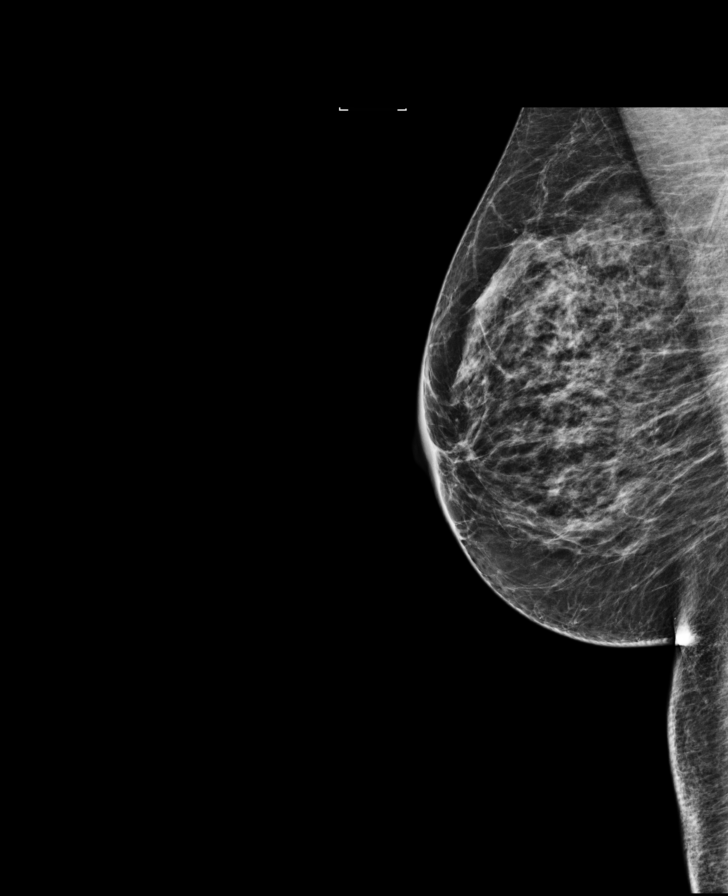

[L CC]
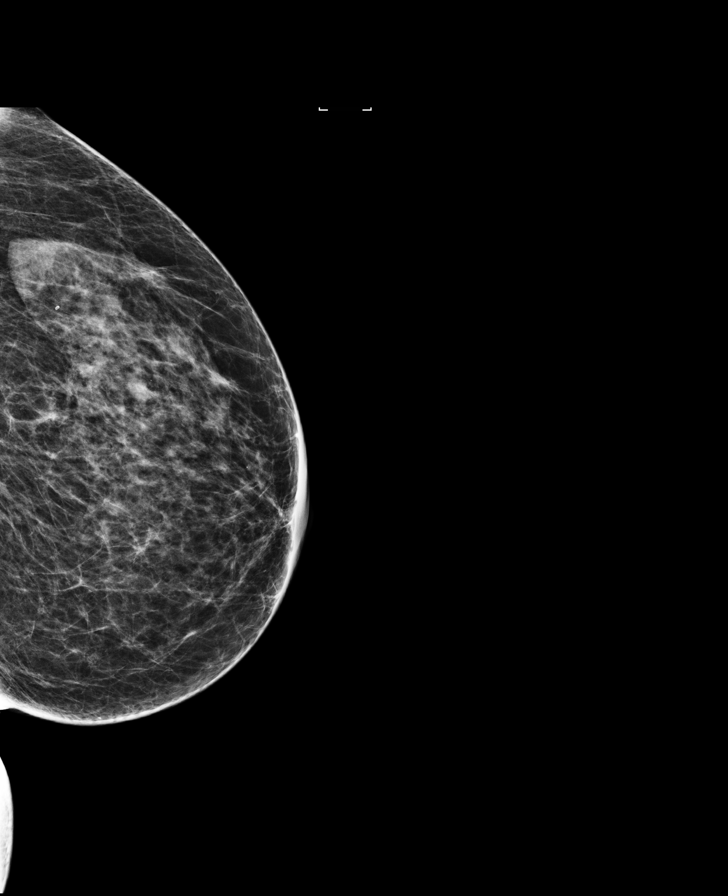

[R CC]
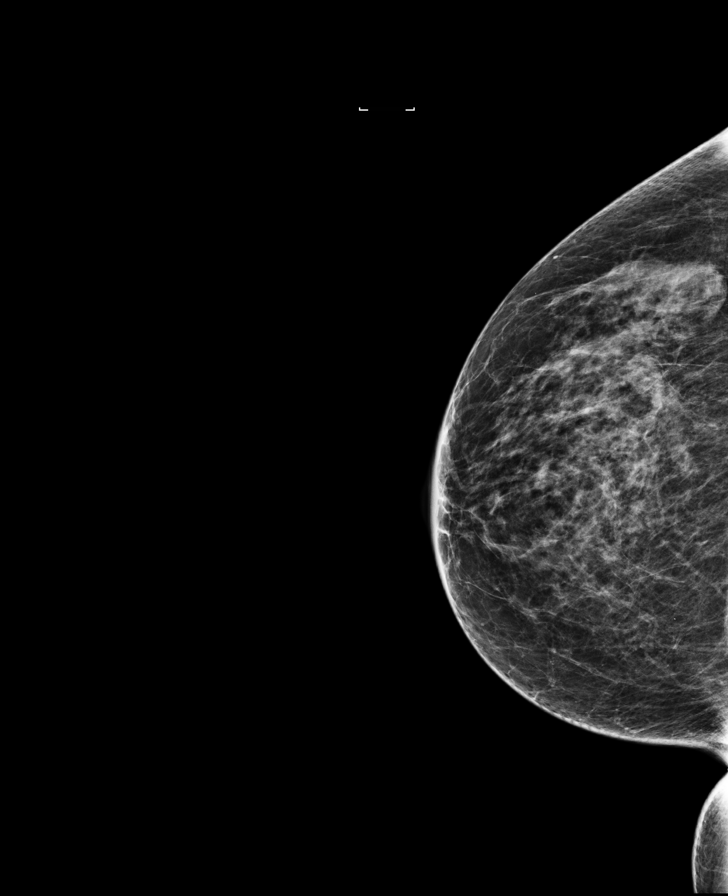

[L MLO synth-2D]
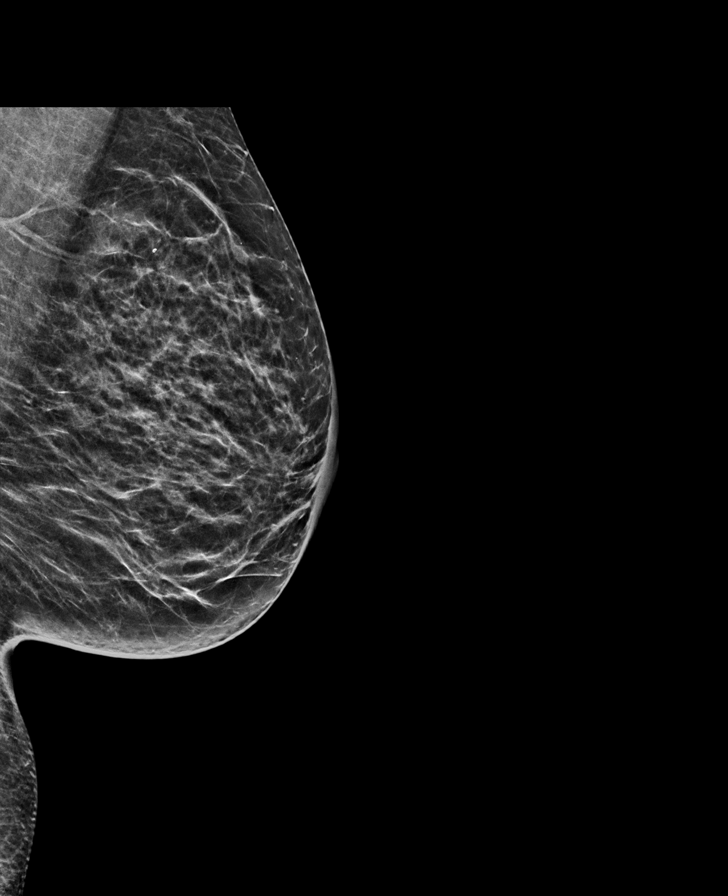

[R CC synth-2D]
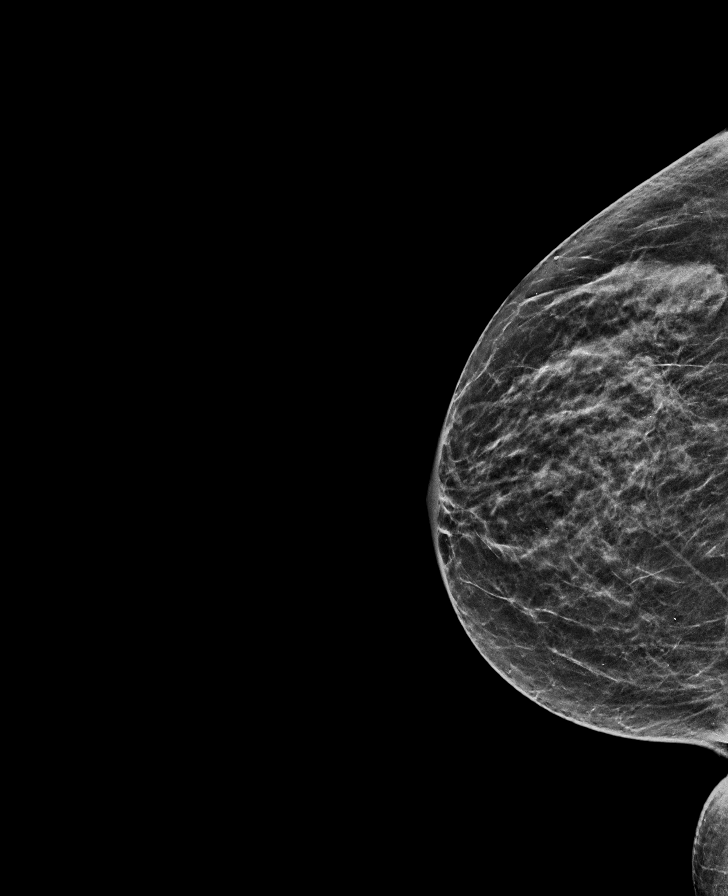

[8 of 28 positions shown; findings below may reference images not displayed]

ACR Breast Density Category c: The breast tissue is heterogeneously
dense, which may obscure small masses.
FINDINGS: There are no findings suspicious for malignancy. Images were
processed with CAD.
IMPRESSION: No mammographic evidence of malignancy. A result letter of this
screening mammogram will be mailed directly to the patient.

RECOMMENDATION:
Screening mammogram in one year. (Code:UA-[id number])

BI-RADS CATEGORY  1: Negative.

## 2018-08-26 ENCOUNTER — Ambulatory Visit: Payer: 59 | Admitting: Certified Nurse Midwife

## 2018-09-20 ENCOUNTER — Other Ambulatory Visit: Payer: Self-pay | Admitting: Obstetrics and Gynecology

## 2018-09-20 DIAGNOSIS — Z1231 Encounter for screening mammogram for malignant neoplasm of breast: Secondary | ICD-10-CM

## 2018-09-22 ENCOUNTER — Encounter: Payer: Self-pay | Admitting: Family Medicine

## 2018-09-27 MED ORDER — SERTRALINE HCL 25 MG PO TABS: 25.0000 mg | ORAL_TABLET | Freq: Every day | ORAL | 5 refills | Status: AC

## 2018-09-30 ENCOUNTER — Encounter: Payer: Self-pay | Admitting: Family Medicine

## 2018-10-05 ENCOUNTER — Ambulatory Visit
Admission: RE | Admit: 2018-10-05 | Discharge: 2018-10-05 | Disposition: A | Discharge status: Home / Self Care | Payer: Managed Care, Other (non HMO) | Source: Ambulatory Visit | Attending: Obstetrics and Gynecology | Admitting: Obstetrics and Gynecology

## 2018-10-05 DIAGNOSIS — Z1231 Encounter for screening mammogram for malignant neoplasm of breast: Secondary | ICD-10-CM | POA: Diagnosis not present

## 2018-10-06 ENCOUNTER — Ambulatory Visit: Payer: 59 | Admitting: Certified Nurse Midwife

## 2018-10-15 ENCOUNTER — Ambulatory Visit: Payer: 59 | Admitting: Certified Nurse Midwife

## 2018-12-09 ENCOUNTER — Encounter: Payer: Self-pay | Admitting: Family Medicine

## 2019-09-23 ENCOUNTER — Other Ambulatory Visit: Payer: Self-pay | Admitting: Obstetrics and Gynecology

## 2019-09-23 DIAGNOSIS — Z1231 Encounter for screening mammogram for malignant neoplasm of breast: Secondary | ICD-10-CM

## 2019-10-26 ENCOUNTER — Ambulatory Visit
Admission: RE | Admit: 2019-10-26 | Discharge: 2019-10-26 | Disposition: A | Discharge status: Home / Self Care | Payer: 59 | Source: Ambulatory Visit | Attending: Obstetrics and Gynecology | Admitting: Obstetrics and Gynecology

## 2019-10-26 DIAGNOSIS — Z1231 Encounter for screening mammogram for malignant neoplasm of breast: Secondary | ICD-10-CM | POA: Diagnosis not present

## 2019-11-01 ENCOUNTER — Other Ambulatory Visit: Payer: Self-pay | Admitting: Obstetrics and Gynecology

## 2019-11-01 DIAGNOSIS — R928 Other abnormal and inconclusive findings on diagnostic imaging of breast: Secondary | ICD-10-CM

## 2019-11-07 ENCOUNTER — Other Ambulatory Visit: Payer: Self-pay | Admitting: Family Medicine

## 2019-11-10 ENCOUNTER — Ambulatory Visit
Admission: RE | Admit: 2019-11-10 | Discharge: 2019-11-10 | Disposition: A | Discharge status: Home / Self Care | Payer: 59 | Source: Ambulatory Visit | Attending: Obstetrics and Gynecology | Admitting: Obstetrics and Gynecology

## 2019-11-10 DIAGNOSIS — R928 Other abnormal and inconclusive findings on diagnostic imaging of breast: Secondary | ICD-10-CM

## 2020-07-09 ENCOUNTER — Ambulatory Visit
Admission: RE | Admit: 2020-07-09 | Discharge: 2020-07-09 | Disposition: A | Discharge status: Home / Self Care | Payer: 59 | Source: Ambulatory Visit | Attending: Family Medicine | Admitting: Family Medicine

## 2020-07-09 ENCOUNTER — Other Ambulatory Visit: Payer: Self-pay

## 2020-07-09 VITALS — BP 117/80 | HR 75 | Temp 98.2°F | Resp 17

## 2020-07-09 DIAGNOSIS — R0981 Nasal congestion: Secondary | ICD-10-CM | POA: Insufficient documentation

## 2020-07-09 DIAGNOSIS — R519 Headache, unspecified: Secondary | ICD-10-CM | POA: Insufficient documentation

## 2020-07-09 DIAGNOSIS — R5383 Other fatigue: Secondary | ICD-10-CM | POA: Insufficient documentation

## 2020-07-09 DIAGNOSIS — H6592 Unspecified nonsuppurative otitis media, left ear: Secondary | ICD-10-CM | POA: Insufficient documentation

## 2020-07-09 DIAGNOSIS — J029 Acute pharyngitis, unspecified: Secondary | ICD-10-CM | POA: Insufficient documentation

## 2020-07-09 LAB — POCT RAPID STREP A (OFFICE): Rapid Strep A Screen: NEGATIVE

## 2020-07-09 NOTE — Discharge Instructions (Addendum)
I have sent in Augmentin for you to take twice a day for 7 days.  I have sent in fluconazole in case of yeast. Take one tablet at the onset of symptoms. If symptoms are still present in 3 days, take the second tablet.   Your rapid strep test is negative.  A throat culture is pending; we will call you if it is positive requiring treatment.    Your COVID and Flu tests are pending.  You should self quarantine until the test results are back.    Take Tylenol or ibuprofen as needed for fever or discomfort.  Rest and keep yourself hydrated.    Follow-up with your primary care provider if your symptoms are not improving.    Follow up in the ER for high fever, trouble swallowing, trouble breathing, other concerning symptoms.

## 2020-07-09 NOTE — ED Triage Notes (Addendum)
Patient c/o since headache, body aches, and sore throat since Saturday.  Patient denies fever, SOB, cough at home.   Patient endorses ear fullness.   Patient has taken Ibuprofen w/ some relief of symptoms.

## 2020-07-10 ENCOUNTER — Telehealth: Payer: Self-pay | Admitting: Family Medicine

## 2020-07-10 ENCOUNTER — Other Ambulatory Visit: Payer: Self-pay | Admitting: Family Medicine

## 2020-07-10 DIAGNOSIS — H6592 Unspecified nonsuppurative otitis media, left ear: Secondary | ICD-10-CM

## 2020-07-10 LAB — COVID-19, FLU A+B AND RSV
Influenza A, NAA: NOT DETECTED
Influenza B, NAA: NOT DETECTED
RSV, NAA: NOT DETECTED
SARS-CoV-2, NAA: NOT DETECTED

## 2020-07-10 MED ORDER — AMOXICILLIN-POT CLAVULANATE 875-125 MG PO TABS: 1.0000 | ORAL_TABLET | Freq: Two times a day (BID) | ORAL | 0 refills | Status: AC

## 2020-07-10 MED ORDER — FLUCONAZOLE 150 MG PO TABS: ORAL_TABLET | ORAL | 0 refills | Status: AC

## 2020-07-10 NOTE — ED Provider Notes (Signed)
[place]   [id number] 07/09/20 Arrival Time: 1501   CC: COVID symptoms  SUBJECTIVE: History from: patient.  Michele Rivera is a 49 y.o. female who presents with abrupt onset of nasal congestion, PND, ear fullness for the last 3 days. Denies sick exposure to COVID, flu or strep. Denies recent travel. Has negative history of Covid. Has completed Covid vaccines. Has taken ibuprofen with little relief. There are no aggravating or alleviating factors. Denies previous symptoms in the past. Denies fever, chills, fatigue, sinus pain, rhinorrhea, SOB, wheezing, chest pain, nausea, changes in bowel or bladder habits.    ROS: As per HPI.  All other pertinent ROS negative.     Past Medical History:  Diagnosis Date  . Anxiety   . GERD (gastroesophageal reflux disease)   . Headache   . History of mammogram 12/28/2012   BRIAD 2 @ [facility]  . Leiomyoma of uterus 2010  . Menorrhagia   . Plantar fasciitis   . PONV (postoperative nausea and vomiting)    following spinal for c section  . Restless legs   . Sleep apnea    Past Surgical History:  Procedure Laterality Date  . CESAREAN SECTION  1999/ 2002  . GASTRIC ROUX-EN-Y N/A 02/13/2015   Procedure: LAPAROSCOPIC ROUX-EN-Y GASTRIC BYPASS WITH UPPER ENDOSCOPY;  Surgeon: Ladora Daniel, MD;  Location: [facility] ORS;  Service: General;  Laterality: N/A;  . LAPAROSCOPIC BILATERAL SALPINGO OOPHERECTOMY  2013  . LAPAROSCOPIC SUPRACERVICAL HYSTERECTOMY  2013   Dr. Laurey Morale for menorrhagia/ fibroids and BSO   Allergies  Allergen Reactions  . Paroxetine     drowsiness  . Phentermine     Chills, chest heaviness  . Sulfa Antibiotics Rash   No current facility-administered medications on file prior to encounter.   Current Outpatient Medications on File Prior to Encounter  Medication Sig Dispense Refill  . Calcium Carbonate-Vit D-Min (CALCIUM 1200) 1200-1000 MG-UNIT CHEW Chew 1 Piece by mouth daily. Bariatric Chew    . Cobalamine  Combinations (B-12) [phone] MCG SUBL Place 1 tablet under the tongue daily.    . Multiple Vitamins-Minerals (BARIATRIC FUSION) CHEW Chew 2 tablets by mouth daily.    . sertraline (ZOLOFT) 25 MG tablet Take 1 tablet (25 mg total) by mouth daily. 30 tablet 5   Social History   Socioeconomic History  . Marital status: Married    Spouse name: Gertie Broerman  . Number of children: 2  . Years of education: 1  . Highest education level: Associate degree: academic program  Occupational History  . Occupation: Editor, commissioning  Tobacco Use  . Smoking status: Never Smoker  . Smokeless tobacco: Never Used  Vaping Use  . Vaping Use: Never used  Substance and Sexual Activity  . Alcohol use: Yes    Comment: one glass of winer per month socially  . Drug use: No  . Sexual activity: Yes    Partners: Male    Birth control/protection: Surgical  Other Topics Concern  . Not on file  Social History Narrative   Lives in [place] with husband and 2 children.   Social Determinants of Health   Financial Resource Strain:   . Difficulty of Paying Living Expenses: Not on file  Food Insecurity:   . Worried About Charity fundraiser in the Last Year: Not on file  . Ran Out of Food in the Last Year: Not on file  Transportation Needs:   . Lack of Transportation (Medical): Not on file  .  Lack of Transportation (Non-Medical): Not on file  Physical Activity:   . Days of Exercise per Week: Not on file  . Minutes of Exercise per Session: Not on file  Stress:   . Feeling of Stress : Not on file  Social Connections:   . Frequency of Communication with Friends and Family: Not on file  . Frequency of Social Gatherings with Friends and Family: Not on file  . Attends Religious Services: Not on file  . Active Member of Clubs or Organizations: Not on file  . Attends Archivist Meetings: Not on file  . Marital Status: Not on file  Intimate Partner Violence:   . Fear of Current or  Ex-Partner: Not on file  . Emotionally Abused: Not on file  . Physically Abused: Not on file  . Sexually Abused: Not on file   Family History  Problem Relation Age of Onset  . Hypertension Mother   . Vision loss Mother   . Alcohol abuse Father   . Cirrhosis Father   . Kidney disease Son   . Healthy Sister   . Healthy Brother   . Heart attack Maternal Grandmother        also maternal great aunts and uncles  . Melanoma Maternal Uncle   . Breast cancer Neg Hx   . Colon cancer Neg Hx    OBJECTIVE:  Vitals:   07/09/20 1604  BP: 117/80  Pulse: 75  Resp: 17  Temp: 98.2 F (36.8 C)  TempSrc: Oral  SpO2: 97%     General appearance: alert; appears fatigued, but nontoxic; speaking in full sentences and tolerating own secretions HEENT: NCAT; Ears: EACs clear, R TM pearly gray, L TM erythematous, bulging with effusion; Eyes: PERRL. EOM grossly intact. Sinuses: nontender; Nose: nares patent without rhinorrhea, Throat: oropharynx erythematous, cobblestoning present, tonsils non erythematous or enlarged, uvula midline  Neck: supple with LAD Lungs: unlabored respirations, symmetrical air entry; cough: absent; no respiratory distress; CTAB Heart: regular rate and rhythm.  Radial pulses 2+ symmetrical bilaterally Skin: warm and dry Psychological: alert and cooperative; normal mood and affect  LABS:  Results for orders placed or performed during the hospital encounter of 07/09/20 (from the past 24 hour(s))  POCT rapid strep A     Status: None   Collection Time: 07/09/20  4:10 PM  Result Value Ref Range   Rapid Strep A Screen Negative Negative   ASSESSMENT & PLAN:  1. Left otitis media with effusion   2. Nasal congestion   3. Nonintractable headache, unspecified chronicity pattern, unspecified headache type   4. Other fatigue    Augmentin prescribed for L OM Prescribed fluconazole in case of yeast  COVID/Flu testing ordered.  It will take between 1-2 days for test results.   Someone will contact you regarding abnormal results.    Patient should remain in quarantine until they have received Covid results.  If negative you may resume normal activities (go back to work/school) while practicing hand hygiene, social distance, and mask wearing.  If positive, patient should remain in quarantine for 10 days from symptom onset AND greater than 72 hours after symptoms resolution (absence of fever without the use of fever-reducing medication and improvement in respiratory symptoms), whichever is longer Get plenty of rest and push fluids Use OTC zyrtec for nasal congestion, runny nose, and/or sore throat Use OTC flonase for nasal congestion and runny nose Use medications daily for symptom relief Use OTC medications like ibuprofen or tylenol as needed fever or  pain Call or go to the ED if you have any new or worsening symptoms such as fever, worsening cough, shortness of breath, chest tightness, chest pain, turning blue, changes in mental status.  Reviewed expectations re: course of current medical issues. Questions answered. Outlined signs and symptoms indicating need for more acute intervention. Patient verbalized understanding. After Visit Summary given.         Faustino Congress, NP 07/10/20 [phone]

## 2020-07-10 NOTE — Telephone Encounter (Signed)
Prescriptions did not get processed through when sent yesterday.

## 2020-07-12 LAB — CULTURE, GROUP A STREP (THRC)

## 2020-11-19 ENCOUNTER — Other Ambulatory Visit: Payer: Self-pay

## 2020-11-19 MED ORDER — SERTRALINE HCL 50 MG PO TABS
1.0000 | ORAL_TABLET | Freq: Every day | ORAL | 0 refills | Status: AC
  Filled 2020-11-19: qty 90, 90d supply, fill #0

## 2020-11-30 ENCOUNTER — Other Ambulatory Visit: Payer: Self-pay

## 2021-04-24 ENCOUNTER — Other Ambulatory Visit: Payer: Self-pay
